# Patient Record
Sex: Female | Born: 1995 | Race: White | Hispanic: No | Marital: Single | Smoking: Never smoker
Health system: Southern US, Community
[De-identification: ages and names within clinical notes are randomized; demographics above are authoritative.]

## PROBLEM LIST (undated history)

## (undated) DIAGNOSIS — K3184 Gastroparesis: Secondary | ICD-10-CM

## (undated) DIAGNOSIS — F419 Anxiety disorder, unspecified: Secondary | ICD-10-CM

## (undated) HISTORY — PX: CHOLECYSTECTOMY: SHX55

---

## 2013-04-02 ENCOUNTER — Encounter (HOSPITAL_COMMUNITY): Payer: Self-pay | Admitting: Emergency Medicine

## 2013-04-02 ENCOUNTER — Emergency Department (HOSPITAL_COMMUNITY)
Admission: EM | Admit: 2013-04-02 | Discharge: 2013-04-02 | Disposition: A | Discharge status: Home / Self Care | Payer: BC Managed Care – PPO | Attending: Emergency Medicine | Admitting: Emergency Medicine

## 2013-04-02 DIAGNOSIS — R1032 Left lower quadrant pain: Secondary | ICD-10-CM | POA: Insufficient documentation

## 2013-04-02 DIAGNOSIS — K625 Hemorrhage of anus and rectum: Secondary | ICD-10-CM | POA: Insufficient documentation

## 2013-04-02 DIAGNOSIS — R1031 Right lower quadrant pain: Secondary | ICD-10-CM | POA: Insufficient documentation

## 2013-04-02 LAB — COMPREHENSIVE METABOLIC PANEL
ALT: 13 U/L (ref 0–35)
Albumin: 4.4 g/dL (ref 3.5–5.2)
Alkaline Phosphatase: 52 U/L (ref 39–117)
BUN: 12 mg/dL (ref 6–23)
Calcium: 9.6 mg/dL (ref 8.4–10.5)
Creatinine, Ser: 0.79 mg/dL (ref 0.50–1.10)
GFR calc non Af Amer: >90 mL/min (ref >90)
Glucose, Bld: 70 mg/dL (ref 70–99)
Potassium: 4.5 mEq/L (ref 3.5–5.1)
Sodium: 137 mEq/L (ref 135–145)
Total Bilirubin: 0.5 mg/dL (ref 0.3–1.2)
Total Protein: 7.6 g/dL (ref 6.0–8.3)

## 2013-04-02 LAB — CBC
Hemoglobin: 13.1 g/dL (ref 12.0–15.0)
MCH: 30.5 pg (ref 26.0–34.0)
MCHC: 33.2 g/dL (ref 30.0–36.0)
MCV: 91.8 fL (ref 78.0–100.0)
RBC: 4.29 MIL/uL (ref 3.87–5.11)
RDW: 13 % (ref 11.5–15.5)

## 2013-04-02 NOTE — ED Notes (Signed)
This RN present for rectal exam performed by Trixie Dredge PA

## 2013-04-02 NOTE — ED Notes (Signed)
Pt comfortable with d/c and f/u instructions. No prescriptions 

## 2013-04-02 NOTE — ED Notes (Signed)
Reports having a bowel movement today and having blood on toilet paper x 2. No acute distress noted at triage.

## 2013-04-02 NOTE — ED Provider Notes (Signed)
CSN: [id number]     Arrival date & time 04/02/13  1657 History   First MD Initiated Contact with Patient 04/02/13 2017     Chief Complaint  Patient presents with  . Rectal Bleeding   (Consider location/radiation/quality/duration/timing/severity/associated sxs/prior Treatment) HPI  Patient reports she had a bowel movement today and found a small amount of blood on the toilet paper.  States she checked again later and again had a small amount of blood on the toilet paper.  She did not look in the toilet to check for blood.  She did check to confirm is was not vaginal blood.  Notes a very mild discomfort in her lower abdomen, when asked how bad it is she states "not that bad."  Denies fevers, chills, N/V, urinary symptoms, abnormal vaginal bleeding or discharge.  Patient's mother has Crohn's disease. She has never had rectal bleeding previously.   History reviewed. No pertinent past medical history. History reviewed. No pertinent past surgical history. History reviewed. No pertinent family history. History  Substance Use Topics  . Smoking status: Never Smoker   . Smokeless tobacco: Not on file  . Alcohol Use: No   OB History   Grav Para Term Preterm Abortions TAB SAB Ect Mult Living                 Review of Systems  Constitutional: Negative for fever and chills.  Respiratory: Negative for shortness of breath.   Cardiovascular: Negative for chest pain.  Gastrointestinal: Positive for anal bleeding. Negative for nausea, vomiting and diarrhea.  Genitourinary: Negative for dysuria, urgency, frequency, vaginal bleeding and vaginal discharge.    Allergies  Review of patient's allergies indicates no known allergies.  Home Medications  No current outpatient prescriptions on file. BP 122/70  Pulse 60  Temp(Src) 98.8 F (37.1 C) (Oral)  Resp 16  SpO2 100%  LMP 03/12/2013 Physical Exam  Nursing note and vitals reviewed. Constitutional: She appears well-developed and  well-nourished. No distress.  HENT:  Head: Normocephalic and atraumatic.  Neck: Neck supple.  Cardiovascular: Normal rate and regular rhythm.   Pulmonary/Chest: Effort normal and breath sounds normal. No respiratory distress. She has no wheezes. She has no rales.  Abdominal: Soft. She exhibits no distension. There is tenderness. There is no rigidity, no rebound and no guarding.  Very mild pain across lower abdomen R>L  Genitourinary: Rectal exam shows no external hemorrhoid, no internal hemorrhoid, no fissure, no mass, no tenderness and anal tone normal.  Scant amount of yellow/brown stool with perhaps 1-2 small dots of red (1mm)  Neurological: She is alert.  Skin: She is not diaphoretic.    ED Course  Procedures (including critical care time) Labs Review Labs Reviewed  POCT PREGNANCY, URINE - Abnormal; Notable for the following:    Preg Test, Ur POSITIVE (*)    All other components within normal limits  OCCULT BLOOD, POC DEVICE - Abnormal; Notable for the following:    Fecal Occult Bld POSITIVE (*)    All other components within normal limits  CBC  COMPREHENSIVE METABOLIC PANEL   Imaging Review No results found.  EKG Interpretation   None      Pregnancy test noted to be positive - this is actually the hemoccult result.  Patient did not give a urine sample and does not believe she could be pregnant at this time.  We did not order or run a pregnancy test in the ED today.   MDM   1. Rectal bleeding  Pt with two episodes of small amount of blood on toilet paper today.  Pt has very mild lower abdominal discomfort.  Abdominal exam is nonsurgical.  Hgb is normal.  No gross blood on exam.  Hemoccult is positive.  No hemorrhoids noted on exam.  Patient has a family hx crohn's disease, d/c home with GI follow up.  Discussed result, findings, treatment, and follow up  with patient.  Pt given return precautions.  Pt verbalizes understanding and agrees with plan.        Trixie Dredge,  PA-C 04/02/13 2355

## 2013-04-02 NOTE — ED Notes (Signed)
Per [facility], Positive Pregnancy Test documented in error. Pregnancy test not performed on patient.

## 2013-04-02 NOTE — ED Notes (Signed)
Enter preg test in machine in error

## 2013-04-03 LAB — POCT PREGNANCY, URINE: Preg Test, Ur: POSITIVE — AB

## 2013-04-03 NOTE — ED Provider Notes (Signed)
Medical screening examination/treatment/procedure(s) were performed by non-physician practitioner and as supervising physician I was immediately available for consultation/collaboration.  Kasyn Rolph L Eames Dibiasio, MD 04/03/13 0004 

## 2013-04-07 ENCOUNTER — Other Ambulatory Visit: Payer: Self-pay | Admitting: Gastroenterology

## 2013-04-07 DIAGNOSIS — R109 Unspecified abdominal pain: Secondary | ICD-10-CM

## 2013-04-09 ENCOUNTER — Ambulatory Visit
Admission: RE | Admit: 2013-04-09 | Discharge: 2013-04-09 | Disposition: A | Discharge status: Home / Self Care | Payer: BC Managed Care – PPO | Source: Ambulatory Visit | Attending: Gastroenterology | Admitting: Gastroenterology

## 2013-04-09 DIAGNOSIS — R109 Unspecified abdominal pain: Secondary | ICD-10-CM

## 2013-04-09 MED ORDER — IOHEXOL 300 MG/ML  SOLN: 100.0000 mL | Freq: Once | INTRAMUSCULAR | Status: AC | PRN

## 2013-07-28 ENCOUNTER — Emergency Department (HOSPITAL_COMMUNITY): Payer: BC Managed Care – PPO

## 2013-07-28 ENCOUNTER — Emergency Department (HOSPITAL_COMMUNITY)
Admission: EM | Admit: 2013-07-28 | Discharge: 2013-07-28 | Disposition: A | Discharge status: Home / Self Care | Payer: BC Managed Care – PPO | Attending: Emergency Medicine | Admitting: Emergency Medicine

## 2013-07-28 ENCOUNTER — Encounter (HOSPITAL_COMMUNITY): Payer: Self-pay | Admitting: Emergency Medicine

## 2013-07-28 DIAGNOSIS — W219XXA Striking against or struck by unspecified sports equipment, initial encounter: Secondary | ICD-10-CM | POA: Insufficient documentation

## 2013-07-28 DIAGNOSIS — J069 Acute upper respiratory infection, unspecified: Secondary | ICD-10-CM | POA: Insufficient documentation

## 2013-07-28 DIAGNOSIS — S060X9A Concussion with loss of consciousness of unspecified duration, initial encounter: Secondary | ICD-10-CM

## 2013-07-28 DIAGNOSIS — Y9367 Activity, basketball: Secondary | ICD-10-CM | POA: Insufficient documentation

## 2013-07-28 DIAGNOSIS — Y9239 Other specified sports and athletic area as the place of occurrence of the external cause: Secondary | ICD-10-CM | POA: Insufficient documentation

## 2013-07-28 DIAGNOSIS — Y92838 Other recreation area as the place of occurrence of the external cause: Secondary | ICD-10-CM

## 2013-07-28 DIAGNOSIS — S060XAA Concussion with loss of consciousness status unknown, initial encounter: Secondary | ICD-10-CM | POA: Insufficient documentation
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
## 2013-07-28 NOTE — Discharge Instructions (Signed)
Concussion, Adult A concussion, or closed-head injury, is a brain injury caused by a direct blow to the head or by a quick and sudden movement (jolt) of the head or neck. Concussions are usually not life-threatening. Even so, the effects of a concussion can be serious. If you have had a concussion before, you are more likely to experience concussion-like symptoms after a direct blow to the head.  CAUSES   Direct blow to the head, such as from running into another player during a soccer game, being hit in a fight, or hitting your head on a hard surface.  A jolt of the head or neck that causes the brain to move back and forth inside the skull, such as in a car crash. SIGNS AND SYMPTOMS  The signs of a concussion can be hard to notice. Early on, they may be missed by you, family members, and health care providers. You may look fine but act or feel differently. Symptoms are usually temporary, but they may last for days, weeks, or even longer. Some symptoms may appear right away while others may not show up for hours or days. Every head injury is different. Symptoms include:   Mild to moderate headaches that will not go away.  A feeling of pressure inside your head.  Having more trouble than usual:   Learning or remembering things you have heard.  Answering questions.  Paying attention or concentrating.   Organizing daily tasks.   Making decisions and solving problems.   Slowness in thinking, acting or reacting, speaking, or reading.   Getting lost or being easily confused.   Feeling tired all the time or lacking energy (fatigued).   Feeling drowsy.   Sleep disturbances.   Sleeping more than usual.   Sleeping less than usual.   Trouble falling asleep.   Trouble sleeping (insomnia).   Loss of balance or feeling lightheaded or dizzy.   Nausea or vomiting.   Numbness or tingling.   Increased sensitivity to:   Sounds.   Lights.   Distractions.    Vision problems or eyes that tire easily.   Diminished sense of taste or smell.   Ringing in the ears.   Mood changes such as feeling sad or anxious.   Becoming easily irritated or angry for little or no reason.   Lack of motivation.  Seeing or hearing things other people do not see or hear (hallucinations). DIAGNOSIS  Your health care provider can usually diagnose a concussion based on a description of your injury and symptoms. He or she will ask whether you passed out (lost consciousness) and whether you are having trouble remembering events that happened right before and during your injury.  Your evaluation might include:   A brain scan to look for signs of injury to the brain. Even if the test shows no injury, you may still have a concussion.   Blood tests to be sure other problems are not present. TREATMENT   Concussions are usually treated in an emergency department, in urgent care, or at a clinic. You may need to stay in the hospital overnight for further treatment.   Tell your health care provider if you are taking any medicines, including prescription medicines, over-the-counter medicines, and natural remedies. Some medicines, such as blood thinners (anticoagulants) and aspirin, may increase the chance of complications. Also tell your health care provider whether you have had alcohol or are taking illegal drugs. This information may affect treatment.  Your health care provider will send you  home with important instructions to follow. °· How fast you will recover from a concussion depends on many factors. These factors include how severe your concussion is, what part of your brain was injured, your age, and how healthy you were before the concussion. °· Most people with mild injuries recover fully. Recovery can take time. In general, recovery is slower in older persons. Also, persons who have had a concussion in the past or have other medical problems may find that it  takes longer to recover from their current injury. °HOME CARE INSTRUCTIONS  °General Instructions °· Carefully follow the directions your health care provider gave you. °· Only take over-the-counter or prescription medicines for pain, discomfort, or fever as directed by your health care provider. °· Take only those medicines that your health care provider has approved. °· Do not drink alcohol until your health care provider says you are well enough to do so. Alcohol and certain other drugs may slow your recovery and can put you at risk of further injury. °· If it is harder than usual to remember things, write them down. °· If you are easily distracted, try to do one thing at a time. For example, do not try to watch TV while fixing dinner. °· Talk with family members or close friends when making important decisions. °· Keep all follow-up appointments. Repeated evaluation of your symptoms is recommended for your recovery. °· Watch your symptoms and tell others to do the same. Complications sometimes occur after a concussion. Older adults with a brain injury may have a higher risk of serious complications such as of a blood clot on the brain. °· Tell your teachers, school nurse, school counselor, coach, athletic trainer, or work manager about your injury, symptoms, and restrictions. Tell them about what you can or cannot do. They should watch for:   °· Increased problems with attention or concentration.   °· Increased difficulty remembering or learning new information.   °· Increased time needed to complete tasks or assignments.   °· Increased irritability or decreased ability to cope with stress.   °· Increased symptoms.   °· Rest. Rest helps the brain to heal. Make sure you: °· Get plenty of sleep at night. Avoid staying up late at night. °· Keep the same bedtime hours on weekends and weekdays. °· Rest during the day. Take daytime naps or rest breaks when you feel tired. °· Limit activities that require a lot of  thought or concentration. These includes   °· Doing homework or job-related work.   °· Watching TV.   °· Working on the computer. °· Avoid any situation where there is potential for another head injury (football, hockey, soccer, basketball, martial arts, downhill snow sports and horseback riding). Your condition will get worse every time you experience a concussion. You should avoid these activities until you are evaluated by the appropriate follow-up caregivers. °Returning To Your Regular Activities °You will need to return to your normal activities slowly, not all at once. You must give your body and brain enough time for recovery. °· Do not return to sports or other athletic activities until your health care provider tells you it is safe to do so. °· Ask your health care provider when you can drive, ride a bicycle, or operate heavy machinery. Your ability to react may be slower after a brain injury. Never do these activities if you are dizzy. °· Ask your health care provider about when you can return to work or school. °Preventing Another Concussion °It is very important to avoid another   brain injury, especially before you have recovered. In rare cases, another injury can lead to permanent brain damage, brain swelling, or death. The risk of this is greatest during the first 7 10 days after a head injury. Avoid injuries by:   Wearing a seat belt when riding in a car.   Drinking alcohol only in moderation.   Wearing a helmet when biking, skiing, skateboarding, skating, or doing similar activities.  Avoiding activities that could lead to a second concussion, such as contact or recreational sports, until your health care provider says it is OK.  Taking safety measures in your home.   Remove clutter and tripping hazards from floors and stairways.   Use grab bars in bathrooms and handrails by stairs.   Place non-slip mats on floors and in bathtubs.   Improve lighting in dim areas. SEEK MEDICAL  CARE IF:   You have increased problems paying attention or concentrating.   You have increased difficulty remembering or learning new information.   You need more time to complete tasks or assignments than before.   You have increased irritability or decreased ability to cope with stress.  You have more symptoms than before. Seek medical care if you have any of the following symptoms for more than 2 weeks after your injury:   Lasting (chronic) headaches.   Dizziness or balance problems.   Nausea.  Vision problems.   Increased sensitivity to noise or light.   Depression or mood swings.   Anxiety or irritability.   Memory problems.   Difficulty concentrating or paying attention.   Sleep problems.   Feeling tired all the time. SEEK IMMEDIATE MEDICAL CARE IF:   You have severe or worsening headaches. These may be a sign of a blood clot in the brain.  You have weakness (even if only in one hand, leg, or part of the face).  You have numbness.  You have decreased coordination.   You vomit repeatedly.  You have increased sleepiness.  One pupil is larger than the other.   You have convulsions.   You have slurred speech.   You have increased confusion. This may be a sign of a blood clot in the brain.  You have increased restlessness, agitation, or irritability.   You are unable to recognize people or places.   You have neck pain.   It is difficult to wake you up.   You have unusual behavior changes.   You lose consciousness. MAKE SURE YOU:   Understand these instructions.  Will watch your condition.  Will get help right away if you are not doing well or get worse. Document Released: 08/11/2003 Document Revised: 01/21/2013 Document Reviewed: 12/11/2012 [facility] Patient Information 2014 [place], [place].  Upper Respiratory Infection, Adult An upper respiratory infection (URI) is also sometimes known as the common cold. The upper  respiratory tract includes the nose, sinuses, throat, trachea, and bronchi. Bronchi are the airways leading to the lungs. Most people improve within 1 week, but symptoms can last up to 2 weeks. A residual cough may last even longer.  CAUSES Many different viruses can infect the tissues lining the upper respiratory tract. The tissues become irritated and inflamed and often become very moist. Mucus production is also common. A cold is contagious. You can easily spread the virus to others by oral contact. This includes kissing, sharing a glass, coughing, or sneezing. Touching your mouth or nose and then touching a surface, which is then touched by another person, can also spread the  virus. SYMPTOMS  Symptoms typically develop 1 to 3 days after you come in contact with a cold virus. Symptoms vary from person to person. They may include:  Runny nose.  Sneezing.  Nasal congestion.  Sinus irritation.  Sore throat.  Loss of voice (laryngitis).  Cough.  Fatigue.  Muscle aches.  Loss of appetite.  Headache.  Low-grade fever. DIAGNOSIS  You might diagnose your own cold based on familiar symptoms, since most people get a cold 2 to 3 times a year. Your caregiver can confirm this based on your exam. Most importantly, your caregiver can check that your symptoms are not due to another disease such as strep throat, sinusitis, pneumonia, asthma, or epiglottitis. Blood tests, throat tests, and X-rays are not necessary to diagnose a common cold, but they may sometimes be helpful in excluding other more serious diseases. Your caregiver will decide if any further tests are required. RISKS AND COMPLICATIONS  You may be at risk for a more severe case of the common cold if you smoke cigarettes, have chronic heart disease (such as heart failure) or lung disease (such as asthma), or if you have a weakened immune system. The very young and very old are also at risk for more serious infections. Bacterial  sinusitis, middle ear infections, and bacterial pneumonia can complicate the common cold. The common cold can worsen asthma and chronic obstructive pulmonary disease (COPD). Sometimes, these complications can require emergency medical care and may be life-threatening. PREVENTION  The best way to protect against getting a cold is to practice good hygiene. Avoid oral or hand contact with people with cold symptoms. Wash your hands often if contact occurs. There is no clear evidence that vitamin C, vitamin E, echinacea, or exercise reduces the chance of developing a cold. However, it is always recommended to get plenty of rest and practice good nutrition. TREATMENT  Treatment is directed at relieving symptoms. There is no cure. Antibiotics are not effective, because the infection is caused by a virus, not by bacteria. Treatment may include:  Increased fluid intake. Sports drinks offer valuable electrolytes, sugars, and fluids.  Breathing heated mist or steam (vaporizer or shower).  Eating chicken soup or other clear broths, and maintaining good nutrition.  Getting plenty of rest.  Using gargles or lozenges for comfort.  Controlling fevers with ibuprofen or acetaminophen as directed by your caregiver.  Increasing usage of your inhaler if you have asthma. Zinc gel and zinc lozenges, taken in the first 24 hours of the common cold, can shorten the duration and lessen the severity of symptoms. Pain medicines may help with fever, muscle aches, and throat pain. A variety of non-prescription medicines are available to treat congestion and runny nose. Your caregiver can make recommendations and may suggest nasal or lung inhalers for other symptoms.  HOME CARE INSTRUCTIONS   Only take over-the-counter or prescription medicines for pain, discomfort, or fever as directed by your caregiver.  Use a warm mist humidifier or inhale steam from a shower to increase air moisture. This may keep secretions moist and  make it easier to breathe.  Drink enough water and fluids to keep your urine clear or pale yellow.  Rest as needed.  Return to work when your temperature has returned to normal or as your caregiver advises. You may need to stay home longer to avoid infecting others. You can also use a face mask and careful hand washing to prevent spread of the virus. SEEK MEDICAL CARE IF:   After the  first few days, you feel you are getting worse rather than better.  You need your caregiver's advice about medicines to control symptoms.  You develop chills, worsening shortness of breath, or brown or red sputum. These may be signs of pneumonia.  You develop yellow or brown nasal discharge or pain in the face, especially when you bend forward. These may be signs of sinusitis.  You develop a fever, swollen neck glands, pain with swallowing, or white areas in the back of your throat. These may be signs of strep throat. SEEK IMMEDIATE MEDICAL CARE IF:   You have a fever.  You develop severe or persistent headache, ear pain, sinus pain, or chest pain.  You develop wheezing, a prolonged cough, cough up blood, or have a change in your usual mucus (if you have chronic lung disease).  You develop sore muscles or a stiff neck. Document Released: 11/14/2000 Document Revised: 08/13/2011 Document Reviewed: 09/22/2010 [facility] Patient Information 2014 [place].

## 2013-09-03 ENCOUNTER — Emergency Department (HOSPITAL_COMMUNITY)
Admission: EM | Admit: 2013-09-03 | Discharge: 2013-09-03 | Disposition: A | Discharge status: Home / Self Care | Payer: BC Managed Care – PPO | Attending: Emergency Medicine | Admitting: Emergency Medicine

## 2013-09-03 ENCOUNTER — Encounter (HOSPITAL_COMMUNITY): Payer: Self-pay | Admitting: Emergency Medicine

## 2013-09-03 DIAGNOSIS — Z3202 Encounter for pregnancy test, result negative: Secondary | ICD-10-CM | POA: Insufficient documentation

## 2013-09-03 DIAGNOSIS — R630 Anorexia: Secondary | ICD-10-CM | POA: Insufficient documentation

## 2013-09-03 DIAGNOSIS — R110 Nausea: Secondary | ICD-10-CM | POA: Insufficient documentation

## 2013-09-03 LAB — POC URINE PREG, ED: PREG TEST UR: NEGATIVE

## 2013-09-03 LAB — COMPREHENSIVE METABOLIC PANEL
ALBUMIN: 4.7 g/dL (ref 3.5–5.2)
ALT: 12 U/L (ref 0–35)
AST: 14 U/L (ref 0–37)
Alkaline Phosphatase: 43 U/L (ref 39–117)
BUN: 13 mg/dL (ref 6–23)
CALCIUM: 10.5 mg/dL (ref 8.4–10.5)
CO2: 27 mEq/L (ref 19–32)
CREATININE: 0.71 mg/dL (ref 0.50–1.10)
Chloride: 99 mEq/L (ref 96–112)
GFR calc Af Amer: >90 mL/min (ref >90)
GFR calc non Af Amer: >90 mL/min (ref >90)
Glucose, Bld: 88 mg/dL (ref 70–99)
Potassium: 4.6 mEq/L (ref 3.7–5.3)
Sodium: 139 mEq/L (ref 137–147)
Total Bilirubin: 0.7 mg/dL (ref 0.3–1.2)
Total Protein: 8 g/dL (ref 6.0–8.3)

## 2013-09-03 LAB — CBC WITH DIFFERENTIAL/PLATELET
BASOS ABS: 0 10*3/uL (ref 0.0–0.1)
BASOS PCT: 0 % (ref 0–1)
EOS PCT: 2 % (ref 0–5)
Eosinophils Absolute: 0.1 10*3/uL (ref 0.0–0.7)
HEMATOCRIT: 39.7 % (ref 36.0–46.0)
Hemoglobin: 13.1 g/dL (ref 12.0–15.0)
Lymphocytes Relative: 32 % (ref 12–46)
Lymphs Abs: 2 10*3/uL (ref 0.7–4.0)
MCH: 29.9 pg (ref 26.0–34.0)
MCHC: 33 g/dL (ref 30.0–36.0)
MCV: 90.6 fL (ref 78.0–100.0)
Monocytes Absolute: 0.4 10*3/uL (ref 0.1–1.0)
Monocytes Relative: 6 % (ref 3–12)
NEUTROS ABS: 3.9 10*3/uL (ref 1.7–7.7)
Neutrophils Relative %: 61 % (ref 43–77)
Platelets: 246 10*3/uL (ref 150–400)
RBC: 4.38 MIL/uL (ref 3.87–5.11)
RDW: 12.7 % (ref 11.5–15.5)
WBC: 6.3 10*3/uL (ref 4.0–10.5)

## 2013-09-03 LAB — URINALYSIS, ROUTINE W REFLEX MICROSCOPIC
Bilirubin Urine: NEGATIVE
Glucose, UA: NEGATIVE mg/dL
Hgb urine dipstick: NEGATIVE
KETONES UR: 40 mg/dL — AB
LEUKOCYTES UA: NEGATIVE
NITRITE: NEGATIVE
Protein, ur: NEGATIVE mg/dL
SPECIFIC GRAVITY, URINE: 1.027 (ref 1.005–1.030)
Urobilinogen, UA: 0.2 mg/dL (ref 0.0–1.0)
pH: 6 (ref 5.0–8.0)

## 2013-09-03 LAB — LIPASE, BLOOD: Lipase: 25 U/L (ref 11–59)

## 2013-09-03 MED ORDER — ONDANSETRON HCL 8 MG PO TABS: 8.0000 mg | ORAL_TABLET | Freq: Three times a day (TID) | ORAL | Status: DC | PRN

## 2013-09-03 MED ORDER — ONDANSETRON 8 MG PO TBDP
8.0000 mg | ORAL_TABLET | Freq: Once | ORAL | Status: AC
  Administered 2013-09-03: 8 mg via ORAL
  Filled 2013-09-03: qty 1

## 2013-09-03 NOTE — ED Notes (Signed)
Complaints of dizziness upon standing. States that due to her nausea she has not attempted to eat or drink adequately. Denies dysuria or constipation at present.

## 2013-09-03 NOTE — ED Provider Notes (Signed)
CSN: [id number]     Arrival date & time 09/03/13  1135 History   First MD Initiated Contact with Patient 09/03/13 1251     Chief Complaint  Patient presents with  . Nausea     (Consider location/radiation/quality/duration/timing/severity/associated sxs/prior Treatment) The history is provided by the patient.   Chelsea Frazier is a 18 y.o. female complains of persistent, nausea and anorexia, that started during her recent., 5 days ago. No vomiting, fever, chills, cough, shortness of breath, chest pain, weakness, or dizziness. She's had this problem recurrently for one year. She has had to be evaluated in the emergency department. She does not think that she is pregnant. There been no changes in bowel or urinary habits. She does not have dysuria or urinary frequency. There are no other known modifying factors.   History reviewed. No pertinent past medical history. History reviewed. No pertinent past surgical history. History reviewed. No pertinent family history. History  Substance Use Topics  . Smoking status: Never Smoker   . Smokeless tobacco: Not on file  . Alcohol Use: No   OB History   Grav Para Term Preterm Abortions TAB SAB Ect Mult Living                 Review of Systems  All other systems reviewed and are negative.      Allergies  Review of patient's allergies indicates no known allergies.  Home Medications   Current Outpatient Rx  Name  Route  Sig  Dispense  Refill  . ondansetron (ZOFRAN) 8 MG tablet   Oral   Take 1 tablet (8 mg total) by mouth every 8 (eight) hours as needed for nausea or vomiting.   20 tablet   0    BP 114/69  Pulse 73  Temp(Src) 97.8 F (36.6 C) (Oral)  Resp 17  SpO2 97%  LMP 08/23/2013 Physical Exam  Nursing note and vitals reviewed. Constitutional: She is oriented to person, place, and time. She appears well-developed and well-nourished.  HENT:  Head: Normocephalic and atraumatic.  Eyes: Conjunctivae and EOM are  normal. Pupils are equal, round, and reactive to light.  Neck: Normal range of motion and phonation normal. Neck supple.  Cardiovascular: Normal rate, regular rhythm and intact distal pulses.   Pulmonary/Chest: Effort normal and breath sounds normal. She exhibits no tenderness.  Abdominal: Soft. She exhibits no distension. There is no tenderness. There is no guarding.  Musculoskeletal: Normal range of motion.  Neurological: She is alert and oriented to person, place, and time. She exhibits normal muscle tone.  Skin: Skin is warm and dry.  Psychiatric: She has a normal mood and affect. Her behavior is normal. Judgment and thought content normal.    ED Course  Procedures (including critical care time) Medications  ondansetron (ZOFRAN-ODT) disintegrating tablet 8 mg (8 mg Oral Given 09/03/13 1235)    Patient Vitals for the past 24 hrs:  BP Temp Temp src Pulse Resp SpO2  09/03/13 1206 114/69 mmHg 97.8 F (36.6 C) Oral 73 17 97 %  09/03/13 1157 118/74 mmHg 98.2 F (36.8 C) Oral 63 16 100 %   13:50- findings were discussed with the patient, and her mother, by phone; all questions answered.  Labs Review Labs Reviewed  URINALYSIS, ROUTINE W REFLEX MICROSCOPIC - Abnormal; Notable for the following:    Color, Urine AMBER (*)    APPearance HAZY (*)    Ketones, ur 40 (*)    All other components within normal limits  CBC  WITH DIFFERENTIAL  COMPREHENSIVE METABOLIC PANEL  LIPASE, BLOOD  POC URINE PREG, ED    MDM   Final diagnoses:  Nausea    Nonspecific nausea, with negative ED evaluation. Doubt serious bacterial infection, metabolic instability, impending vascular collapse. She's not pregnant and there is no apparent UTI.  Nursing Notes Reviewed/ Care Coordinated Applicable Imaging Reviewed Interpretation of Laboratory Data incorporated into ED treatment  The patient appears reasonably screened and/or stabilized for discharge and I doubt any other medical condition or other [facility]  requiring further screening, evaluation, or treatment in the ED at this time prior to discharge.  Plan: Home Medications- Zofran; Home Treatments- rest, push PO fluids; return here if the recommended treatment, does not improve the symptoms; Recommended follow up- PCP prn    Flint MelterElliott L Nikira Kushnir, MD 09/03/13 1357

## 2013-09-03 NOTE — Discharge Instructions (Signed)
Use the nausea medicine, before trying to eat. Try to drink both water and Gatorade, frequently. See the gynecologist as scheduled, to be evaluated for a menstrual abnormality. Return here, as needed, for problems.  Nausea and Vomiting Nausea is a sick feeling that often comes before throwing up (vomiting). Vomiting is a reflex where stomach contents come out of your mouth. Vomiting can cause severe loss of body fluids (dehydration). Children and elderly adults can become dehydrated quickly, especially if they also have diarrhea. Nausea and vomiting are symptoms of a condition or disease. It is important to find the cause of your symptoms. CAUSES   Direct irritation of the stomach lining. This irritation can result from increased acid production (gastroesophageal reflux disease), infection, food poisoning, taking certain medicines (such as nonsteroidal anti-inflammatory drugs), alcohol use, or tobacco use.  Signals from the brain.These signals could be caused by a headache, heat exposure, an inner ear disturbance, increased pressure in the brain from injury, infection, a tumor, or a concussion, pain, emotional stimulus, or metabolic problems.  An obstruction in the gastrointestinal tract (bowel obstruction).  Illnesses such as diabetes, hepatitis, gallbladder problems, appendicitis, kidney problems, cancer, sepsis, atypical symptoms of a heart attack, or eating disorders.  Medical treatments such as chemotherapy and radiation.  Receiving medicine that makes you sleep (general anesthetic) during surgery. DIAGNOSIS Your caregiver may ask for tests to be done if the problems do not improve after a few days. Tests may also be done if symptoms are severe or if the reason for the nausea and vomiting is not clear. Tests may include:  Urine tests.  Blood tests.  Stool tests.  Cultures (to look for evidence of infection).  X-rays or other imaging studies. Test results can help your caregiver  make decisions about treatment or the need for additional tests. TREATMENT You need to stay well hydrated. Drink frequently but in small amounts.You may wish to drink water, sports drinks, clear broth, or eat frozen ice pops or gelatin dessert to help stay hydrated.When you eat, eating slowly may help prevent nausea.There are also some antinausea medicines that may help prevent nausea. HOME CARE INSTRUCTIONS   Take all medicine as directed by your caregiver.  If you do not have an appetite, do not force yourself to eat. However, you must continue to drink fluids.  If you have an appetite, eat a normal diet unless your caregiver tells you differently.  Eat a variety of complex carbohydrates (rice, wheat, potatoes, bread), lean meats, yogurt, fruits, and vegetables.  Avoid high-fat foods because they are more difficult to digest.  Drink enough water and fluids to keep your urine clear or pale yellow.  If you are dehydrated, ask your caregiver for specific rehydration instructions. Signs of dehydration may include:  Severe thirst.  Dry lips and mouth.  Dizziness.  Dark urine.  Decreasing urine frequency and amount.  Confusion.  Rapid breathing or pulse. SEEK IMMEDIATE MEDICAL CARE IF:   You have blood or brown flecks (like coffee grounds) in your vomit.  You have black or bloody stools.  You have a severe headache or stiff neck.  You are confused.  You have severe abdominal pain.  You have chest pain or trouble breathing.  You do not urinate at least once every 8 hours.  You develop cold or clammy skin.  You continue to vomit for longer than 24 to 48 hours.  You have a fever. MAKE SURE YOU:   Understand these instructions.  Will watch your condition.  Will get help right away if you are not doing well or get worse. Document Released: 05/21/2005 Document Revised: 08/13/2011 Document Reviewed: 10/18/2010 [facility] Patient Information 2014 [place], [place].

## 2013-09-03 NOTE — ED Notes (Signed)
Voiced understanding of instructions given 

## 2013-09-03 NOTE — ED Notes (Signed)
Pt states she is having nausea with decreased appetite.  Slight abdominal pain.  Pt states she normally does this with each menstrual period but it normally goes away.  Symptoms not getting better this month.

## 2013-09-08 ENCOUNTER — Encounter: Payer: Self-pay | Admitting: Obstetrics

## 2013-09-08 ENCOUNTER — Encounter (INDEPENDENT_AMBULATORY_CARE_PROVIDER_SITE_OTHER): Payer: Self-pay

## 2013-09-08 ENCOUNTER — Ambulatory Visit (INDEPENDENT_AMBULATORY_CARE_PROVIDER_SITE_OTHER): Payer: BC Managed Care – PPO | Admitting: Obstetrics

## 2013-09-08 VITALS — BP 110/69 | HR 65 | Temp 97.1°F | Ht 67.0 in | Wt 120.0 lb

## 2013-09-08 DIAGNOSIS — Z3009 Encounter for other general counseling and advice on contraception: Secondary | ICD-10-CM

## 2013-09-08 DIAGNOSIS — N946 Dysmenorrhea, unspecified: Secondary | ICD-10-CM

## 2013-09-08 MED ORDER — NORETHIN-ETH ESTRAD-FE BIPHAS 1 MG-10 MCG / 10 MCG PO TABS: 1.0000 | ORAL_TABLET | Freq: Every day | ORAL | Status: DC

## 2013-09-08 MED ORDER — IBUPROFEN 800 MG PO TABS: 800.0000 mg | ORAL_TABLET | Freq: Three times a day (TID) | ORAL | Status: DC

## 2013-09-08 NOTE — Progress Notes (Addendum)
Subjective:     Chelsea Frazier is a 18 y.o. female here for a routine exam.  Current complaints: Patient in office today for a consult due to her period. Patient states she is having painful menstrual cycles. Patient states the pain is a sharp shooting pain that comes and goes. Patient states she is also having nausea and headaches to accompany the pain. Patient states she has not tried any comfort measures to help with the pain.  Patient states in November she had a CT Scan that showed a cyst on her left ovary.     The HPI was reviewed and explored in further detail by the provider. Gynecologic History Patient's last menstrual period was 08/23/2013. Contraception: abstinence Last Pap: Never had a Pap  Obstetric History OB History  Gravida Para Term Preterm AB SAB TAB Ectopic Multiple Living  0 0 0 0 0 0 0 0 0 0          The following portions of the patient's history were reviewed and updated as appropriate: allergies, current medications, past family history, past medical history, past social history, past surgical history and problem list.  Review of Systems Genitourinary:positive for abnormal menstrual periods    Objective:    General appearance: alert and no distress Lungs: clear to auscultation bilaterally Heart: regular rate and rhythm, S1, S2 normal, no murmur, click, rub or gallop Abdomen: normal findings: soft, non-tender Pelvic: cervix normal in appearance, external genitalia normal, no adnexal masses or tenderness, no cervical motion tenderness, rectovaginal septum normal, uterus normal size, shape, and consistency and vagina normal without discharge      Assessment:    Dysmenorrhea  Ovarian cyst.  Normal physiologic cyst on CT scan.  Will follow clinically.   Plan:    Education reviewed: safe sex/STD prevention and management of dysmenorhea.. Contraception: OCP (estrogen/progesterone). Follow up in: 3 months. Ibuprofen Rx for dysmenorrhea.

## 2013-09-10 ENCOUNTER — Encounter: Payer: Self-pay | Admitting: Obstetrics

## 2013-09-21 ENCOUNTER — Encounter: Payer: Self-pay | Admitting: Obstetrics

## 2014-01-26 ENCOUNTER — Emergency Department (HOSPITAL_COMMUNITY)
Admission: EM | Admit: 2014-01-26 | Discharge: 2014-01-26 | Disposition: A | Discharge status: Home / Self Care | Payer: BC Managed Care – PPO | Attending: Emergency Medicine | Admitting: Emergency Medicine

## 2014-01-26 ENCOUNTER — Encounter (HOSPITAL_COMMUNITY): Payer: Self-pay | Admitting: Emergency Medicine

## 2014-01-26 DIAGNOSIS — F411 Generalized anxiety disorder: Secondary | ICD-10-CM | POA: Diagnosis not present

## 2014-01-26 DIAGNOSIS — J029 Acute pharyngitis, unspecified: Secondary | ICD-10-CM | POA: Diagnosis present

## 2014-01-26 LAB — RAPID STREP SCREEN (MED CTR MEBANE ONLY): Streptococcus, Group A Screen (Direct): NEGATIVE

## 2014-01-26 MED ORDER — LORAZEPAM 1 MG PO TABS
1.0000 mg | ORAL_TABLET | Freq: Once | ORAL | Status: AC
  Administered 2014-01-26: 1 mg via ORAL
  Filled 2014-01-26: qty 1

## 2014-01-26 MED ORDER — PREDNISONE 50 MG PO TABS: 50.0000 mg | ORAL_TABLET | Freq: Every day | ORAL | Status: DC

## 2014-01-26 MED ORDER — ACETAMINOPHEN-CODEINE 120-12 MG/5ML PO SOLN: 10.0000 mL | ORAL | Status: DC | PRN

## 2014-01-26 MED ORDER — AMOXICILLIN 875 MG PO TABS: 875.0000 mg | ORAL_TABLET | Freq: Two times a day (BID) | ORAL | Status: DC

## 2014-01-26 NOTE — Discharge Instructions (Signed)
Return here as needed.  Increase your fluid intake, rest as much possible. °

## 2014-01-26 NOTE — ED Provider Notes (Signed)
CSN: [id number]     Arrival date & time 01/26/14  0848 History   First MD Initiated Contact with Patient 01/26/14 1005     Chief Complaint  Patient presents with  . Sore Throat     (Consider location/radiation/quality/duration/timing/severity/associated sxs/prior Treatment) HPI Patient presents to the emergency department with sore throat, and mild cough, over the last few days.  The patient, states, that she feels, like her throat is swelling.  The patient, states, that she has had tingling in both of her hands as well.  Patient denies earache, chest pain, shortness of breath, vomiting, fever, abdominal pain, or syncope.  Patient, states, that she did not take any medications prior to arrival other than over-the-counter cough suppressants.  Patient, states nothing seems to make her condition, better or worse History reviewed. No pertinent past medical history. History reviewed. No pertinent past surgical history. No family history on file. History  Substance Use Topics  . Smoking status: Never Smoker   . Smokeless tobacco: Not on file  . Alcohol Use: No   OB History   Grav Para Term Preterm Abortions TAB SAB Ect Mult Living       Review of Systems   All other systems negative except as documented in the HPI. All pertinent positives and negatives as reviewed in the HPI. Allergies  Review of patient's allergies indicates no known allergies.  Home Medications   Prior to Admission medications   Medication Sig Start Date End Date Taking? Authorizing Provider  Chlorphen-Pseudoephed-APAP (FLU/COLD MEDICINE PO) Take 2 tablets by mouth once.   Yes Historical Provider, MD  ondansetron (ZOFRAN) 4 MG tablet Take 4 mg by mouth every 8 (eight) hours as needed for nausea or vomiting.   Yes Historical Provider, MD   BP 110/77  Pulse 74  Temp(Src) 98 F (36.7 C) (Oral)  Resp 18  SpO2 100%  LMP 01/21/2014 Physical Exam  Nursing note and vitals  reviewed. Constitutional: She is oriented to person, place, and time. She appears well-developed and well-nourished. No distress.  Patient appears very anxious and is crying on my examination  HENT:  Head: Normocephalic and atraumatic.  Right Ear: Tympanic membrane normal.  Left Ear: Tympanic membrane normal.  Mouth/Throat: Uvula is midline and mucous membranes are normal. No trismus in the jaw. No uvula swelling. Posterior oropharyngeal erythema present. No oropharyngeal exudate, posterior oropharyngeal edema or tonsillar abscesses.  Eyes: Pupils are equal, round, and reactive to light.  Neck: Normal range of motion. Neck supple.  Cardiovascular: Normal rate, regular rhythm and normal heart sounds.  Exam reveals no gallop and no friction rub.   No murmur heard. Pulmonary/Chest: Effort normal and breath sounds normal. No respiratory distress.  Neurological: She is alert and oriented to person, place, and time.  Skin: Skin is warm and dry. No rash noted. No erythema.  Psychiatric: Her mood appears anxious.    ED Course  Procedures (including critical care time) Labs Review Labs Reviewed  RAPID STREP SCREEN  CULTURE, GROUP A STREP   Patient be treated for pharyngitis, and advised to return here as needed.  Told to increase her fluid intake, and rest as much possible.  Patient was given Ativan here in the emergency department due to her anxiety.    Carlyle Dolly, PA-C 01/26/14 1125

## 2014-01-28 LAB — CULTURE, GROUP A STREP

## 2014-01-29 NOTE — ED Provider Notes (Signed)
Medical screening examination/treatment/procedure(s) were performed by non-physician practitioner and as supervising physician I was immediately available for consultation/collaboration.   Thomasenia Dowse T Aeden Matranga, MD 01/29/14 0744 

## 2014-06-17 ENCOUNTER — Encounter: Payer: Self-pay | Admitting: Sports Medicine

## 2014-06-17 ENCOUNTER — Ambulatory Visit (INDEPENDENT_AMBULATORY_CARE_PROVIDER_SITE_OTHER): Admitting: Sports Medicine

## 2014-06-17 VITALS — BP 102/67 | Temp 97.6°F | Ht 67.5 in | Wt 115.0 lb

## 2014-06-17 DIAGNOSIS — J069 Acute upper respiratory infection, unspecified: Secondary | ICD-10-CM | POA: Insufficient documentation

## 2014-06-17 DIAGNOSIS — B9789 Other viral agents as the cause of diseases classified elsewhere: Principal | ICD-10-CM

## 2014-06-17 MED ORDER — ALBUTEROL SULFATE HFA 108 (90 BASE) MCG/ACT IN AERS: 2.0000 | INHALATION_SPRAY | RESPIRATORY_TRACT | Status: DC | PRN

## 2014-06-17 MED ORDER — AZITHROMYCIN 500 MG PO TABS: 500.0000 mg | ORAL_TABLET | Freq: Every day | ORAL | Status: DC

## 2014-06-17 NOTE — Progress Notes (Signed)
  Chelsea Frazier - 19 y.o. female MRN [id number]  Date of birth: 01-14-1996  SUBJECTIVE:  Including CC & ROS.  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 no history of tobacco use, distant history of exercise inducted asthma but does not use inhaler regularly, no history of COPD. Denies Nausea, vomiting, diarrhea.  Appetite normal, and Drinking fluids Relieving factors: no improve with OTC tylenol cold/flu Symptoms not improving but no worse  HISTORY: Past Medical, Surgical, Social, and Family History Reviewed & Updated per EMR. Pertinent Historical Findings include: No asthma, nonsmoker Sick contact on basketbal team   ROS:  Constitutional:  No fever, chills, or fatigue.  Respiratory:  No shortness of breath, cough, or wheezing Cardiovascular:  No palpitations, chest pain or syncope Gastrointestinal:  No nausea, no abdominal pain Review of systems otherwise negative except for what is stated in HPI  PHYSICAL EXAM:  VS: BP:102/67 mmHg  HR: bpm  TEMP:97.6 F (36.4 C)(Oral)  RESP:   HT:5' 7.5" (171.5 cm)   WT:115 lb (52.164 kg)  BMI:17.8 PHYSICAL EXAM: General:  Alert and oriented, No acute distress.   HENT:  Normocephalic, Oral mucosa is moist.  Eyes are equal and reactive to light, normal conjunctivae, normal hearing, mucous membrane is moist, no erythema, no exudate.  Bilateral ears have fluid with bulging but no erythema, TMs are intact.  Both nasal passages are inflamed, erythematous, with clear drainage and inflamed turbinate.  Sinus passages are tender to palpation.  Submandibular glands are fluctuant mobile and soft.  Respiratory:  Lungs are clear to  auscultation, Respirations are non-labored, Symmetrical chest wall expansion.   Cardiovascular:  Normal rate, Regular rhythm, No murmur, Good pulses equal in all extremities, No edema.   Gastrointestinal:  Soft, Non-tender, Non-distended, Normal bowel sounds, No organomegaly.   Integumentary:  Warm, Dry, No rash.   Neurologic:  Alert, Oriented, No focal defects Psychiatric:  Cooperative, Appropriate mood & affect.    ASSESSMENT & PLAN:  URI Plan: I suspect the patient is suffering from a viral upper respiratory infection based on the short duration of the symptoms, non-productive cough, nasal congestion, clear pharynx, afebrile presentation with subjective fever, normal PO intake, and no significant signs of bacterial infection seen on physical exam. Recommendations for symptomatic relief with over-the-counter agents was recommended.  Patient was provided with a handout to guide them in choosing the appropriate over-the-counter agents for each of their symptoms. Patient was educated that they will benefit from symptomatic control with OTC decongestants, Tylenol or Motrin for fevers, saline nasal sprays, Plenty of fluids and rest, return if no better in 5-7 days, sooner if worse.  Predated prescription of azithromycin 500 mg daily for 5 days was sent to her pharmacy to start on Sunday if symptoms are worsening or persisting Also provided patient with albuterol inhaler to help with breathing improvement with basketball practice and only continuing this if this is clinically helpful Will follow-up with patient on Saturday prior to the possible game

## 2014-09-29 ENCOUNTER — Encounter (HOSPITAL_COMMUNITY): Payer: Self-pay | Admitting: Emergency Medicine

## 2014-09-29 ENCOUNTER — Emergency Department (HOSPITAL_COMMUNITY)
Admission: EM | Admit: 2014-09-29 | Discharge: 2014-09-29 | Disposition: A | Discharge status: Home / Self Care | Payer: Managed Care, Other (non HMO) | Source: Home / Self Care | Attending: Emergency Medicine | Admitting: Emergency Medicine

## 2014-09-29 ENCOUNTER — Emergency Department (HOSPITAL_COMMUNITY): Payer: Managed Care, Other (non HMO)

## 2014-09-29 ENCOUNTER — Emergency Department (HOSPITAL_COMMUNITY)
Admission: EM | Admit: 2014-09-29 | Discharge: 2014-09-29 | Disposition: A | Discharge status: Home / Self Care | Payer: Managed Care, Other (non HMO) | Attending: Emergency Medicine | Admitting: Emergency Medicine

## 2014-09-29 DIAGNOSIS — Z792 Long term (current) use of antibiotics: Secondary | ICD-10-CM | POA: Diagnosis not present

## 2014-09-29 DIAGNOSIS — Z79899 Other long term (current) drug therapy: Secondary | ICD-10-CM | POA: Diagnosis not present

## 2014-09-29 DIAGNOSIS — R55 Syncope and collapse: Secondary | ICD-10-CM | POA: Insufficient documentation

## 2014-09-29 DIAGNOSIS — J111 Influenza due to unidentified influenza virus with other respiratory manifestations: Secondary | ICD-10-CM | POA: Diagnosis not present

## 2014-09-29 DIAGNOSIS — R69 Illness, unspecified: Secondary | ICD-10-CM

## 2014-09-29 DIAGNOSIS — B349 Viral infection, unspecified: Secondary | ICD-10-CM

## 2014-09-29 DIAGNOSIS — R0602 Shortness of breath: Secondary | ICD-10-CM | POA: Diagnosis present

## 2014-09-29 DIAGNOSIS — R112 Nausea with vomiting, unspecified: Secondary | ICD-10-CM

## 2014-09-29 LAB — BASIC METABOLIC PANEL
Anion gap: 12 (ref 5–15)
BUN: 14 mg/dL (ref 6–23)
CO2: 21 mmol/L (ref 19–32)
CREATININE: 0.86 mg/dL (ref 0.50–1.10)
Calcium: 10 mg/dL (ref 8.4–10.5)
Chloride: 106 mmol/L (ref 96–112)
GFR calc Af Amer: >90 mL/min (ref >90)
GFR calc non Af Amer: >90 mL/min (ref >90)
Glucose, Bld: 106 mg/dL — ABNORMAL HIGH (ref 70–99)
Potassium: 3.6 mmol/L (ref 3.5–5.1)
Sodium: 139 mmol/L (ref 135–145)

## 2014-09-29 LAB — CBC
HCT: 40.7 % (ref 36.0–46.0)
Hemoglobin: 14 g/dL (ref 12.0–15.0)
MCH: 31.2 pg (ref 26.0–34.0)
MCHC: 34.4 g/dL (ref 30.0–36.0)
MCV: 90.6 fL (ref 78.0–100.0)
Platelets: 283 10*3/uL (ref 150–400)
RBC: 4.49 MIL/uL (ref 3.87–5.11)
RDW: 12.8 % (ref 11.5–15.5)
WBC: 13.8 10*3/uL — ABNORMAL HIGH (ref 4.0–10.5)

## 2014-09-29 LAB — I-STAT TROPONIN, ED: Troponin i, poc: 0 ng/mL (ref 0.00–0.08)

## 2014-09-29 LAB — BRAIN NATRIURETIC PEPTIDE: B Natriuretic Peptide: 26.5 pg/mL (ref 0.0–100.0)

## 2014-09-29 MED ORDER — ALBUTEROL SULFATE HFA 108 (90 BASE) MCG/ACT IN AERS
2.0000 | INHALATION_SPRAY | RESPIRATORY_TRACT | Status: AC
  Administered 2014-09-29: 2 via RESPIRATORY_TRACT
  Filled 2014-09-29: qty 6.7

## 2014-09-29 MED ORDER — NAPROXEN 500 MG PO TABS: 500.0000 mg | ORAL_TABLET | Freq: Two times a day (BID) | ORAL | Status: DC

## 2014-09-29 MED ORDER — SODIUM CHLORIDE 0.9 % IV BOLUS (SEPSIS)
1000.0000 mL | Freq: Once | INTRAVENOUS | Status: AC
  Administered 2014-09-29: 1000 mL via INTRAVENOUS

## 2014-09-29 MED ORDER — ONDANSETRON 4 MG PO TBDP
4.0000 mg | ORAL_TABLET | Freq: Once | ORAL | Status: DC
  Filled 2014-09-29: qty 1

## 2014-09-29 MED ORDER — KETOROLAC TROMETHAMINE 60 MG/2ML IM SOLN
60.0000 mg | Freq: Once | INTRAMUSCULAR | Status: AC
  Administered 2014-09-29: 60 mg via INTRAMUSCULAR
  Filled 2014-09-29: qty 2

## 2014-09-29 MED ORDER — ONDANSETRON 8 MG PO TBDP
8.0000 mg | ORAL_TABLET | Freq: Once | ORAL | Status: AC
  Administered 2014-09-29: 8 mg via ORAL
  Filled 2014-09-29: qty 1

## 2014-09-29 MED ORDER — ONDANSETRON HCL 4 MG PO TABS: 4.0000 mg | ORAL_TABLET | Freq: Four times a day (QID) | ORAL | Status: DC

## 2014-09-29 MED ORDER — METOCLOPRAMIDE HCL 5 MG/ML IJ SOLN
10.0000 mg | Freq: Once | INTRAMUSCULAR | Status: AC
  Administered 2014-09-29: 10 mg via INTRAVENOUS
  Filled 2014-09-29: qty 2

## 2014-09-29 MED ORDER — BENZONATATE 100 MG PO CAPS: 100.0000 mg | ORAL_CAPSULE | Freq: Three times a day (TID) | ORAL | Status: DC

## 2014-09-29 MED ORDER — OSELTAMIVIR PHOSPHATE 75 MG PO CAPS: 75.0000 mg | ORAL_CAPSULE | Freq: Two times a day (BID) | ORAL | Status: DC

## 2014-09-29 MED ORDER — ONDANSETRON HCL 4 MG/2ML IJ SOLN
4.0000 mg | Freq: Once | INTRAMUSCULAR | Status: AC
  Administered 2014-09-29: 4 mg via INTRAVENOUS
  Filled 2014-09-29: qty 2

## 2014-09-29 NOTE — Progress Notes (Signed)
Pt states she will follow up with pcp in [place], Dr Geoffery SpruceLineberger

## 2014-09-29 NOTE — Discharge Instructions (Signed)
Return to the emergency room with worsening of symptoms, new symptoms or with symptoms that are concerning, , especially fevers, stiff neck, worsening headache, nausea/vomiting, visual changes or slurred speech, chest pain, shortness of breath, cough with thick colored mucous or blood Drink plenty of fluids with electrolytes especially Gatorade. OTC cold medications such as mucinex, nyquil, dayquil are recommended. Chloraseptic for sore throat. BRAT diet: bananas, rice, applesauce, toast zofran as needed for nausea. Read below information and follow recommendations. Nausea and Vomiting Nausea is a sick feeling that often comes before throwing up (vomiting). Vomiting is a reflex where stomach contents come out of your mouth. Vomiting can cause severe loss of body fluids (dehydration). Children and elderly adults can become dehydrated quickly, especially if they also have diarrhea. Nausea and vomiting are symptoms of a condition or disease. It is important to find the cause of your symptoms. CAUSES   Direct irritation of the stomach lining. This irritation can result from increased acid production (gastroesophageal reflux disease), infection, food poisoning, taking certain medicines (such as nonsteroidal anti-inflammatory drugs), alcohol use, or tobacco use.  Signals from the brain.These signals could be caused by a headache, heat exposure, an inner ear disturbance, increased pressure in the brain from injury, infection, a tumor, or a concussion, pain, emotional stimulus, or metabolic problems.  An obstruction in the gastrointestinal tract (bowel obstruction).  Illnesses such as diabetes, hepatitis, gallbladder problems, appendicitis, kidney problems, cancer, sepsis, atypical symptoms of a heart attack, or eating disorders.  Medical treatments such as chemotherapy and radiation.  Receiving medicine that makes you sleep (general anesthetic) during surgery. DIAGNOSIS Your caregiver may ask for  tests to be done if the problems do not improve after a few days. Tests may also be done if symptoms are severe or if the reason for the nausea and vomiting is not clear. Tests may include:  Urine tests.  Blood tests.  Stool tests.  Cultures (to look for evidence of infection).  X-rays or other imaging studies. Test results can help your caregiver make decisions about treatment or the need for additional tests. TREATMENT You need to stay well hydrated. Drink frequently but in small amounts.You may wish to drink water, sports drinks, clear broth, or eat frozen ice pops or gelatin dessert to help stay hydrated.When you eat, eating slowly may help prevent nausea.There are also some antinausea medicines that may help prevent nausea. HOME CARE INSTRUCTIONS   Take all medicine as directed by your caregiver.  If you do not have an appetite, do not force yourself to eat. However, you must continue to drink fluids.  If you have an appetite, eat a normal diet unless your caregiver tells you differently.  Eat a variety of complex carbohydrates (rice, wheat, potatoes, bread), lean meats, yogurt, fruits, and vegetables.  Avoid high-fat foods because they are more difficult to digest.  Drink enough water and fluids to keep your urine clear or pale yellow.  If you are dehydrated, ask your caregiver for specific rehydration instructions. Signs of dehydration may include:  Severe thirst.  Dry lips and mouth.  Dizziness.  Dark urine.  Decreasing urine frequency and amount.  Confusion.  Rapid breathing or pulse. SEEK IMMEDIATE MEDICAL CARE IF:   You have blood or brown flecks (like coffee grounds) in your vomit.  You have black or bloody stools.  You have a severe headache or stiff neck.  You are confused.  You have severe abdominal pain.  You have chest pain or trouble breathing.  You  do not urinate at least once every 8 hours.  You develop cold or clammy skin.  You  continue to vomit for longer than 24 to 48 hours.  You have a fever. MAKE SURE YOU:   Understand these instructions.  Will watch your condition.  Will get help right away if you are not doing well or get worse. Document Released: 05/21/2005 Document Revised: 08/13/2011 Document Reviewed: 10/18/2010 [facility] Patient Information 2015 [place], [place]. This information is not intended to replace advice given to you by your health care provider. Make sure you discuss any questions you have with your health care provider.

## 2014-09-29 NOTE — Discharge Instructions (Signed)
Please call your doctor for a followup appointment within 24-48 hours. When you talk to your doctor please let them know that you were seen in the emergency department and have them acquire all of your records so that they can discuss the findings with you and formulate a treatment plan to fully care for your new and ongoing problems. ° °

## 2014-09-29 NOTE — ED Notes (Signed)
Pt was d/c from ED. When walking to car pt's legs became weak and she fell. Pt unsure if she passed out. Also reports new onset of bilateral arm tingling. Otherwise symptoms the same

## 2014-09-29 NOTE — ED Notes (Addendum)
Pt reports she began to have productive cough yesterday. Began to have SOB at 0830 this am along with sweating and emesis. Pt shaky in triage.  No hx of recent travel.

## 2014-09-29 NOTE — ED Provider Notes (Signed)
CSN: [id number]     Arrival date & time 09/29/14  1125 History   First MD Initiated Contact with Patient 09/29/14 1326     Chief Complaint  Patient presents with  . Near Syncope  . Fall     (Consider location/radiation/quality/duration/timing/severity/associated sxs/prior Treatment) HPI  Chelsea Frazier is a 19 y.o. female presenting with near syncopal episode while ambulating to the car. Patient was evaluated in the emergency room earlier today and diagnosed with flulike illness and emesis. Patient reported no change in her symptoms but felt nauseated with bilateral arm numbness tingling and went to vomit and fell to her knees. She denies loss of consciousness or any head injury. She reported initial numbness, tingling but no weakness. No headache, visual changes or slurred speech.   History reviewed. No pertinent past medical history. History reviewed. No pertinent past surgical history. History reviewed. No pertinent family history. History  Substance Use Topics  . Smoking status: Never Smoker   . Smokeless tobacco: Not on file  . Alcohol Use: No   OB History    Gravida Para Term Preterm AB TAB SAB Ectopic Multiple Living   0 0 0 0 0 0 0 0 0 0      Review of Systems 10 Systems reviewed and are negative for acute change except as noted in the HPI.    Allergies  Review of patient's allergies indicates no known allergies.  Home Medications   Prior to Admission medications   Medication Sig Start Date End Date Taking? Authorizing Provider  acetaminophen (TYLENOL) 500 MG tablet Take 500 mg by mouth every 6 (six) hours as needed for mild pain or fever.   Yes Historical Provider, MD  albuterol (PROVENTIL HFA;VENTOLIN HFA) 108 (90 BASE) MCG/ACT inhaler Inhale 2 puffs into the lungs every 4 (four) hours as needed for wheezing or shortness of breath (cough, shortness of breath or wheezing.). 06/17/14  Yes Deanna M Didiano, DO  azithromycin (ZITHROMAX) 500 MG tablet Take 1 tablet (500  mg total) by mouth daily. Patient not taking: Reported on 09/29/2014 06/20/14   Deanna M Didiano, DO  benzonatate (TESSALON) 100 MG capsule Take 1 capsule (100 mg total) by mouth every 8 (eight) hours. Patient not taking: Reported on 09/29/2014 09/29/14   Eber HongBrian Miller, MD  Chlorphen-Pseudoephed-APAP (FLU/COLD MEDICINE PO) Take 2 tablets by mouth once.    Historical Provider, MD  naproxen (NAPROSYN) 500 MG tablet Take 1 tablet (500 mg total) by mouth 2 (two) times daily with a meal. Patient not taking: Reported on 09/29/2014 09/29/14   Eber HongBrian Miller, MD  ondansetron (ZOFRAN) 4 MG tablet Take 1 tablet (4 mg total) by mouth every 6 (six) hours. 09/29/14   Oswaldo ConroyVictoria Skiler Tye, PA-C  oseltamivir (TAMIFLU) 75 MG capsule Take 1 capsule (75 mg total) by mouth every 12 (twelve) hours. Patient not taking: Reported on 09/29/2014 09/29/14   Eber HongBrian Miller, MD   BP 125/70 mmHg  Pulse 57  Temp(Src) 97.6 F (36.4 C) (Oral)  Resp 16  SpO2 100%  LMP 09/28/2014 Physical Exam  Constitutional: She appears well-developed and well-nourished. No distress.  HENT:  Head: Normocephalic and atraumatic.  Mouth/Throat: Oropharynx is clear and moist.  Eyes: Conjunctivae and EOM are normal. Pupils are equal, round, and reactive to light. Right eye exhibits no discharge. Left eye exhibits no discharge.  Neck: Normal range of motion. Neck supple.  No nuchal rigidity  Cardiovascular: Normal rate and regular rhythm.   Pulmonary/Chest: Effort normal and breath sounds normal. No respiratory distress. She  has no wheezes.  Abdominal: Soft. Bowel sounds are normal. She exhibits no distension. There is no tenderness.  Neurological: She is alert. No cranial nerve deficit. Coordination normal.  Speech is clear and goal oriented. Strength 5/5 in upper and lower extremities. Sensation intact. No pronator drift. Normal gait.   Skin: Skin is warm and dry. She is not diaphoretic.  Nursing note and vitals reviewed.   ED Course  Procedures  (including critical care time) Labs Review Labs Reviewed - No data to display  Imaging Review Dg Chest 2 View (if Patient Has Fever And/or Copd)  09/29/2014   CLINICAL DATA:  Severe shortness of breath beginning this morning.  EXAM: CHEST  2 VIEW  COMPARISON:  None.  FINDINGS: Heart size and mediastinal contours are within normal limits. Both lungs are clear. Visualized skeletal structures are unremarkable.  IMPRESSION: Normal exam.   Electronically Signed   By: Drusilla Kanner M.D.   On: 09/29/2014 10:47     EKG Interpretation None      Meds given in ED:  Medications  ondansetron (ZOFRAN-ODT) disintegrating tablet 8 mg (8 mg Oral Given 09/29/14 1232)  sodium chloride 0.9 % bolus 1,000 mL (0 mLs Intravenous Stopped 09/29/14 1616)  metoCLOPramide (REGLAN) injection 10 mg (10 mg Intravenous Given 09/29/14 1449)  ondansetron (ZOFRAN) injection 4 mg (4 mg Intravenous Given 09/29/14 1604)    Discharge Medication List as of 09/29/2014  4:12 PM        MDM   Final diagnoses:  Non-intractable vomiting with nausea, vomiting of unspecified type  Viral syndrome  Near syncope   Pt reports near syncopal episode while walking to the car. No LOC. VSS. Neurological exam without deficits. Pt with post tussive emesis. No abdominal tenderness. Pt otherwise healthy female with a few episodes of emesis. Non bloody nonbilious. No indication for repeat labs for earlier today. Pt given zofran and reglan with improvement of her nausea and is tolerating fluids without difficulty. Pt states she feels much better. Discharged with zofran and PCP follow up.  Discussed return precautions with patient. Discussed all results and patient verbalizes understanding and agrees with plan.   Oswaldo Conroy, PA-C 09/30/14 1610  Linwood Dibbles, MD 09/30/14 [phone]

## 2014-09-29 NOTE — ED Notes (Signed)
Pt tolerates well 222 mL of ginger ale, although she reports mild nausea, she states it is better that it was. Ready for dx with Zofran Rx.

## 2014-09-29 NOTE — ED Provider Notes (Signed)
CSN: [id number]     Arrival date & time 09/29/14  [id number] History   First MD Initiated Contact with Patient 09/29/14 1044     Chief Complaint  Patient presents with  . Shortness of Breath  . Emesis     (Consider location/radiation/quality/duration/timing/severity/associated sxs/prior Treatment) HPI Comments: The patient is a 19 year old female who presents from home after being sick for the last 2 days with cough which is nonproductive, occasionally with some phlegm, diffuse body aches, chills, sore throat, nasal congestion. She noted that today she had some difficulty breathing because of pain which restricted her ability to breathe in. She denies any swelling of the lower extremities, trauma, travel, immobilization, she does not use oral contraceptive pills, has not had any recent surgery. Her roommate who presents with her states that she had similar symptoms about 4 days ago but hers resolved spontaneously. The patient denies diarrhea, dysuria, swelling, rashes, weakness, numbness, blurred vision, headache.  Patient is a 19 y.o. female presenting with shortness of breath and vomiting. The history is provided by the patient.  Shortness of Breath Associated symptoms: vomiting   Emesis   History reviewed. No pertinent past medical history. History reviewed. No pertinent past surgical history. History reviewed. No pertinent family history. History  Substance Use Topics  . Smoking status: Never Smoker   . Smokeless tobacco: Not on file  . Alcohol Use: No   OB History    Gravida Para Term Preterm AB TAB SAB Ectopic Multiple Living   0 0 0 0 0 0 0 0 0 0      Review of Systems  Respiratory: Positive for shortness of breath.   Gastrointestinal: Positive for vomiting.  All other systems reviewed and are negative.     Allergies  Review of patient's allergies indicates no known allergies.  Home Medications   Prior to Admission medications   Medication Sig Start Date End Date  Taking? Authorizing Provider  albuterol (PROVENTIL HFA;VENTOLIN HFA) 108 (90 BASE) MCG/ACT inhaler Inhale 2 puffs into the lungs every 4 (four) hours as needed for wheezing or shortness of breath (cough, shortness of breath or wheezing.). 06/17/14   Deanna M Didiano, DO  azithromycin (ZITHROMAX) 500 MG tablet Take 1 tablet (500 mg total) by mouth daily. 06/20/14   Deanna M Didiano, DO  benzonatate (TESSALON) 100 MG capsule Take 1 capsule (100 mg total) by mouth every 8 (eight) hours. 09/29/14   Eber HongBrian Wilburta Milbourn, MD  Chlorphen-Pseudoephed-APAP (FLU/COLD MEDICINE PO) Take 2 tablets by mouth once.    Historical Provider, MD  naproxen (NAPROSYN) 500 MG tablet Take 1 tablet (500 mg total) by mouth 2 (two) times daily with a meal. 09/29/14   Eber HongBrian Cerinity Zynda, MD  ondansetron (ZOFRAN) 4 MG tablet Take 4 mg by mouth every 8 (eight) hours as needed for nausea or vomiting.    Historical Provider, MD  oseltamivir (TAMIFLU) 75 MG capsule Take 1 capsule (75 mg total) by mouth every 12 (twelve) hours. 09/29/14   Eber HongBrian Autry Droege, MD   BP 112/66 mmHg  Pulse 68  Temp(Src) 98 F (36.7 C) (Oral)  Resp 18  SpO2 100%  LMP 09/28/2014 Physical Exam  Constitutional: She appears well-developed and well-nourished. No distress.  HENT:  Head: Normocephalic and atraumatic.  Mouth/Throat: Oropharynx is clear and moist. No oropharyngeal exudate.  Tympanic membranes clear bilaterally, nasal passages with clear rhinorrhea, oropharynx with mild erythema but no exudate asymmetry or hypertrophy, mucous members are moist  Eyes: Conjunctivae and EOM are normal. Pupils are equal, round,  and reactive to light. Right eye exhibits no discharge. Left eye exhibits no discharge. No scleral icterus.  Neck: Normal range of motion. Neck supple. No JVD present. No thyromegaly present.  Very supple neck, shotty anterior cervical lymph nodes  Cardiovascular: Normal rate, regular rhythm, normal heart sounds and intact distal pulses.  Exam reveals no gallop  and no friction rub.   No murmur heard. Pulmonary/Chest: Effort normal and breath sounds normal. No respiratory distress. She has no wheezes. She has no rales.  Abdominal: Soft. Bowel sounds are normal. She exhibits no distension and no mass. There is no tenderness.  Musculoskeletal: Normal range of motion. She exhibits no edema or tenderness.  Lymphadenopathy:    She has no cervical adenopathy.  Neurological: She is alert. Coordination normal.  Skin: Skin is warm and dry. No rash noted. No erythema.  Psychiatric: She has a normal mood and affect. Her behavior is normal.  Nursing note and vitals reviewed.   ED Course  Procedures (including critical care time) Labs Review Labs Reviewed  CBC - Abnormal; Notable for the following:    WBC 13.8 (*)    All other components within normal limits  BASIC METABOLIC PANEL  BRAIN NATRIURETIC PEPTIDE  I-STAT TROPOININ, ED    Imaging Review Dg Chest 2 View (if Patient Has Fever And/or Copd)  09/29/2014   CLINICAL DATA:  Severe shortness of breath beginning this morning.  EXAM: CHEST  2 VIEW  COMPARISON:  None.  FINDINGS: Heart size and mediastinal contours are within normal limits. Both lungs are clear. Visualized skeletal structures are unremarkable.  IMPRESSION: Normal exam.   Electronically Signed   By: Drusilla Kanner M.D.   On: 09/29/2014 10:47     EKG Interpretation   Date/Time:  Wednesday September 29 2014 10:12:29 EDT Ventricular Rate:  67 PR Interval:    QRS Duration: 90 QT Interval:  383 QTC Calculation: 404 R Axis:   89 Text Interpretation:  Normal sinus rhythm Nonspecific T wave abnormality  No old tracing to compare Confirmed by Alexey Rhoads  MD, Arden Tinoco ([id number]) on  09/29/2014 10:59:57 AM      MDM   Final diagnoses:  Influenza-like illness    The patient has normal cardiac and pulmonary exams, her chest x-ray is negative for acute infiltrates as ordered by nursing, vital signs are totally normal, see below, will treat with  symptom Medicare for bronchitis upper respiratory infection, possibly flulike symptoms. Patient stable for discharge, no indication for bacterial coverage or further testing. Leukocytosis likely secondary to underlying infection.  Filed Vitals:   09/29/14 1008 09/29/14 1051  BP: 130/106 112/66  Pulse: 95 68  Temp:  98 F (36.7 C)  TempSrc: Oral Oral  Resp: 20 18  SpO2: 96% 100%     Meds given in ED:  Medications  albuterol (PROVENTIL HFA;VENTOLIN HFA) 108 (90 BASE) MCG/ACT inhaler 2 puff (2 puffs Inhalation Given 09/29/14 1102)  ketorolac (TORADOL) injection 60 mg (60 mg Intramuscular Given 09/29/14 1102)    New Prescriptions   BENZONATATE (TESSALON) 100 MG CAPSULE    Take 1 capsule (100 mg total) by mouth every 8 (eight) hours.   NAPROXEN (NAPROSYN) 500 MG TABLET    Take 1 tablet (500 mg total) by mouth 2 (two) times daily with a meal.   OSELTAMIVIR (TAMIFLU) 75 MG CAPSULE    Take 1 capsule (75 mg total) by mouth every 12 (twelve) hours.        Eber Hong, MD 09/29/14 [phone]

## 2014-10-01 ENCOUNTER — Ambulatory Visit (INDEPENDENT_AMBULATORY_CARE_PROVIDER_SITE_OTHER): Payer: Managed Care, Other (non HMO) | Admitting: Physician Assistant

## 2014-10-01 VITALS — BP 128/76 | HR 65 | Temp 98.0°F | Resp 24

## 2014-10-01 DIAGNOSIS — R0602 Shortness of breath: Secondary | ICD-10-CM | POA: Diagnosis not present

## 2014-10-01 DIAGNOSIS — G43A0 Cyclical vomiting, not intractable: Secondary | ICD-10-CM | POA: Diagnosis not present

## 2014-10-01 DIAGNOSIS — R079 Chest pain, unspecified: Secondary | ICD-10-CM

## 2014-10-01 DIAGNOSIS — R1115 Cyclical vomiting syndrome unrelated to migraine: Secondary | ICD-10-CM

## 2014-10-01 LAB — COMPLETE METABOLIC PANEL WITH GFR
ALT: 18 U/L (ref 0–35)
AST: 22 U/L (ref 0–37)
Albumin: 4.9 g/dL (ref 3.5–5.2)
Alkaline Phosphatase: 41 U/L (ref 39–117)
BUN: 13 mg/dL (ref 6–23)
CO2: 18 mEq/L — ABNORMAL LOW (ref 19–32)
CREATININE: 0.75 mg/dL (ref 0.50–1.10)
Calcium: 10.1 mg/dL (ref 8.4–10.5)
Chloride: 103 mEq/L (ref 96–112)
GFR, Est Non African American: >89 mL/min
Glucose, Bld: 86 mg/dL (ref 70–99)
Potassium: 3.8 mEq/L (ref 3.5–5.3)
Sodium: 139 mEq/L (ref 135–145)
Total Bilirubin: 0.6 mg/dL (ref 0.2–1.2)
Total Protein: 7.4 g/dL (ref 6.0–8.3)

## 2014-10-01 LAB — POCT CBC
GRANULOCYTE PERCENT: 90.3 % — AB (ref 37–80)
HEMATOCRIT: 39.1 % (ref 37.7–47.9)
HEMOGLOBIN: 12.3 g/dL (ref 12.2–16.2)
Lymph, poc: 1.2 (ref 0.6–3.4)
MCH, POC: 28.6 pg (ref 27–31.2)
MCHC: 31.5 g/dL — AB (ref 31.8–35.4)
MCV: 90.9 fL (ref 80–97)
MID (cbc): 0.1 (ref 0–0.9)
MPV: 8.7 fL (ref 0–99.8)
POC Granulocyte: 12.3 — AB (ref 2–6.9)
POC LYMPH PERCENT: 8.9 %L — AB (ref 10–50)
POC MID %: 0.8 % (ref 0–12)
Platelet Count, POC: 220 10*3/uL (ref 142–424)
RBC: 4.3 M/uL (ref 4.04–5.48)
RDW, POC: 14.3 %
WBC: 13.6 10*3/uL — AB (ref 4.6–10.2)

## 2014-10-01 LAB — POCT SEDIMENTATION RATE: POCT SED RATE: 14 mm/hr (ref 0–22)

## 2014-10-01 LAB — LIPASE: Lipase: 19 U/L (ref 0–75)

## 2014-10-01 MED ORDER — ALPRAZOLAM 0.25 MG PO TABS: 0.2500 mg | ORAL_TABLET | Freq: Two times a day (BID) | ORAL | Status: DC | PRN

## 2014-10-01 MED ORDER — ONDANSETRON HCL 4 MG/2ML IJ SOLN
4.0000 mg | Freq: Once | INTRAMUSCULAR | Status: AC
  Administered 2014-10-01: 4 mg via INTRAMUSCULAR

## 2014-10-01 MED ORDER — ALPRAZOLAM 0.25 MG PO TABS
0.2500 mg | ORAL_TABLET | Freq: Once | ORAL | Status: AC
  Administered 2014-10-01: 0.25 mg via ORAL

## 2014-10-01 MED ORDER — PROMETHAZINE HCL 25 MG/ML IJ SOLN
25.0000 mg | Freq: Once | INTRAMUSCULAR | Status: AC
  Administered 2014-10-01: 25 mg via INTRAMUSCULAR

## 2014-10-01 MED ORDER — PROMETHAZINE HCL 25 MG PO TABS: 25.0000 mg | ORAL_TABLET | Freq: Three times a day (TID) | ORAL | Status: DC | PRN

## 2014-10-01 NOTE — Patient Instructions (Signed)
Drink and stay hydrated.  Eat bland food.  Do not drink ETOH or smoke.  Try to stay mentally calm.  Go ahead and start your OCP.   Take the nausea medication when you need it. Take the medication for anxiety also when you need it. Go ahead and make an appt with you PCP for when you get home.

## 2014-10-01 NOTE — Progress Notes (Signed)
Subjective:    Patient ID: Chelsea Frazier, female    DOB: 18-Nov-1995, 19 y.o.   MRN: [id number]  HPI  Pt presents to clinic with nausea and vomiting with a slight cough that has been going on for 4 days.  Upon further questioning she has had similar symptoms intermittently throughout the last year.  She is complaining of nausea, vomiting (which she cannot stop currently), shaking, generalized fatigue, cough, shortness of breath, chest pain and tingling in her hands that has been present on and off for a couple of days. Denies any diarrhea. She says that her symptoms are slightly worse today than they were the other day when she went to the ED.   She says the nausea has been occurring on and off for one month, and she has been "making appointments" to evaluate for other GI issues that may be contributing to her nausea. She thought she might have Crohn's but she was evaluated by a GI specialist who could not find the cause of her complaints. She had an upper endoscopy and colonoscopy done within the last year in regards to the nausea, and reports that they were both normal. She thinks that the nausea originally started in relation to her menstrual cycle, and she thought her menses was causing the nausea and vomiting, she has no cramping with her menses. She saw a GYN, who prescribed birth control for symptoms, but she never started taking the prescription. After her meg GI work-up she saw a different GYN who also prescribed birth control, and told her to start this prescription the Sunday following her most recent menstrual cycle. She is planning on starting the birth control 10/03/14.  She is sexually active with females only.  Her current episode started 2 days after her menses started and she is no longer bleeding currently.  She was seen in the Emergency Department 09/29/14 for her current episode of symptoms. She was diagnosed with the flu, but they never tested her for it. She was given Zofran IV in the  ED which seemed to temporarily help her nausea. She was also given a prescription for Tamiflu but was unable to fill the prescription due to changes with her insurance. She has not taken the Tamiflu, but has taken cough medicine that she was prescribed. She was discharged from the ED, and when she was walking to her car she almost passed out, and was then re-admitted to the ED. She had an EKG, troponin, and chest x-ray done in the ED. She was discharged later that afternoon.   Yesterday after her ER visit she was feeling better, and was able to eat pizza last night for dinner and feel fine.  She was able to keep that down and did not have any vomiting or nausea episodes afterwards. This morning she woke up feeling ill, nauseous and had another episode of vomiting. She describes her vomiting as more of a dry heave than actually being able to vomit any substance. She had tea this morning and was also able to keep that down. She has not had anything else to eat today. She has never had heartburn symptoms throughout this episode or previous episodes.   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 Her final exams are next week, and being sick and not feeling well only seems to make her more stressed. She does not report eating any different in front of friends than when  he is by herself. She does not report exercising more than usual or retching after she works out or eats.  She has no depressed mood or lack of interest in things that she enjoys.  She does not feel like she is an anxious person.  Her mother does have panic attacks at times.  Otherwise no mental health issues in her family.   Review of Systems  Constitutional: Positive for fever, chills, diaphoresis and fatigue.  HENT: Positive for congestion, rhinorrhea, sore throat and tinnitus. Negative for ear pain and postnasal drip.   Eyes: Negative for photophobia, pain and visual  disturbance.  Respiratory: Positive for cough and shortness of breath.   Cardiovascular: Positive for chest pain. Negative for palpitations.  Gastrointestinal: Positive for nausea, vomiting and abdominal pain. Negative for diarrhea.  Musculoskeletal: Positive for myalgias.  Neurological: Positive for dizziness.       Objective:   Physical Exam  Constitutional: She is oriented to person, place, and time. She appears well-developed and well-nourished. She appears distressed (at beginning of the visit but it improved prior to her discharge).  BP 122/70 mmHg  Pulse 62  Temp(Src) 98 F (36.7 C)  Resp 24  SpO2 99%  LMP 09/28/2014  HENT:  Head: Normocephalic and atraumatic.  Right Ear: Hearing, tympanic membrane, external ear and ear canal normal.  Left Ear: Hearing, tympanic membrane, external ear and ear canal normal.  Nose: Nose normal.  Mouth/Throat: Uvula is midline and mucous membranes are normal. Posterior oropharyngeal erythema present.  Eyes: Conjunctivae are normal. Pupils are equal, round, and reactive to light. No scleral icterus.  Neck: Neck supple.  Cardiovascular: Normal rate, regular rhythm and normal heart sounds.  Exam reveals no gallop and no friction rub.   No murmur heard. Pulmonary/Chest: Effort normal and breath sounds normal. She has no wheezes. She has no rhonchi. She has no rales.  Abdominal: Soft. Bowel sounds are normal. She exhibits no mass. There is no tenderness.  Lymphadenopathy:    She has no cervical adenopathy.       Right: No supraclavicular adenopathy present.       Left: No supraclavicular adenopathy present.  Neurological: She is alert and oriented to person, place, and time.  Skin: Skin is warm and dry.  Psychiatric: Her mood appears anxious (slightly).  Fidgeting in the chair which resolved towards the end of the visit and when I talk directly at her requiring her answer questions.   Results for orders placed or performed in visit on 10/01/14    POCT SEDIMENTATION RATE  Result Value Ref Range   POCT SED RATE 14 0 - 22 mm/hr  POCT CBC  Result Value Ref Range   WBC 13.6 (A) 4.6 - 10.2 K/uL   Lymph, poc 1.2 0.6 - 3.4   POC LYMPH PERCENT 8.9 (A) 10 - 50 %L   MID (cbc) 0.1 0 - 0.9   POC MID % 0.8 0 - 12 %M   POC Granulocyte 12.3 (A) 2 - 6.9   Granulocyte percent 90.3 (A) 37 - 80 %G   RBC 4.30 4.04 - 5.48 M/uL   Hemoglobin 12.3 12.2 - 16.2 g/dL   HCT, POC 39.1 37.7 - 47.9 %   MCV 90.9 80 - 97 fL   MCH, POC 28.6 27 - 31.2 pg   MCHC 31.5 (A) 31.8 - 35.4 g/dL   RDW, POC 14.3 %   Platelet Count, POC 220 142 - 424 K/uL   MPV 8.7 0 - 99.8 fL  Orthostatic vital signs: Lying 123/71 mmHg  Sitting 138/77 mmHg  Standing 128/76 mmHg IV started and 500 cc normal saline given over 4 hours - pt felt better and was ready to leave after this much fluid.  IM Zofran - only mild improvement in her nausea. IM phenergan - improved nausea after about 15 mins.  Spent >69mns with the patient in the room with >50% in counseling.    Assessment & Plan:  She was seen in the ED and diagnosed with the flu. Not convinced that explains her symptoms. Started with IM Zofran 4 mg that did not seem to resolve her symptoms. She felt that IV fluids would help, so we started an IV and gave 500 cc fluid over 4 hours. Checked a CMP to rule out possible liver or gallbladder issues. ESR to rule out inflammatory process that would need a further workup. Lipase to rule out pancreatitis causing the nausea. Discussed with patient with a neg GI work-up and no obvious Gyn cause while waiting on the labs but the ones so far basically normal (the increased WBC is most likely from stress reaction of continuous retching) the possibility of stress and anxiety become more liekly the cause of her current symptoms. She indicated anxiety was a likely contributor to her symptoms, so we gave Xanax 0.25 mg tablet in clinic. She subsequently felt better and more calm. Her nausea was only  40% improved at this time, so gave IM Phenergan 25 mg. Gave her a prescription for Phenergan and Xanax to get her through the next week of exams, until she is able to get home and make an appointment with her primary care physician.  She will RTC if any problems. We discussed that her chest pain was most likely resulting from significant retching and acidic emesis.  SOB seemed worse as the nausea was worse and with a normal work-up in the ED last night these were not further investigated.  After the Xanax they were both better but she still stated the nausea was always the worse problem for her.  I spoke to both her mother and father on the phone regarding her current situation.  1. Non-intractable cyclical vomiting with nausea - ondansetron (ZOFRAN) injection 4 mg; Inject 2 mLs (4 mg total) into the muscle once. - POCT SEDIMENTATION RATE - COMPLETE METABOLIC PANEL WITH GFR - Lipase - POCT CBC - ALPRAZolam (XANAX) tablet 0.25 mg; Take 1 tablet (0.25 mg total) by mouth once. - promethazine (PHENERGAN) injection 25 mg; Inject 1 mL (25 mg total) into the muscle once. - promethazine (PHENERGAN) 25 MG tablet; Take 1 tablet (25 mg total) by mouth every 8 (eight) hours as needed for nausea or vomiting.  Dispense: 20 tablet; Refill: 0 - ALPRAZolam (XANAX) 0.25 MG tablet; Take 1 tablet (0.25 mg total) by mouth 2 (two) times daily as needed for anxiety.  Dispense: 15 tablet; Refill: 0  2. Chest pain, unspecified chest pain type Resolved. Chest pain was a combination of retching and anxiety. After receiving the Xanax and Phenergan, she no longer experienced chest pain, but ws still mildly nauseous.   3. SOB (shortness of breath) Resolved. Shortness of breath was a combination of retching and anxiety. After receiving the Xanax and Phenergan, she no longer experienced shortness of breath, but was still mildly nauseous.   SWindell HummingbirdPA-C  [facility] 10/01/2014  5:40 PM

## 2015-03-06 ENCOUNTER — Emergency Department (HOSPITAL_COMMUNITY)
Admission: EM | Admit: 2015-03-06 | Discharge: 2015-03-06 | Disposition: A | Discharge status: Home / Self Care | Payer: Managed Care, Other (non HMO) | Attending: Emergency Medicine | Admitting: Emergency Medicine

## 2015-03-06 ENCOUNTER — Emergency Department (HOSPITAL_COMMUNITY): Payer: Managed Care, Other (non HMO)

## 2015-03-06 ENCOUNTER — Encounter (HOSPITAL_COMMUNITY): Payer: Self-pay | Admitting: Emergency Medicine

## 2015-03-06 DIAGNOSIS — R109 Unspecified abdominal pain: Secondary | ICD-10-CM

## 2015-03-06 DIAGNOSIS — R1084 Generalized abdominal pain: Secondary | ICD-10-CM | POA: Diagnosis present

## 2015-03-06 DIAGNOSIS — Z3202 Encounter for pregnancy test, result negative: Secondary | ICD-10-CM | POA: Diagnosis not present

## 2015-03-06 DIAGNOSIS — R0682 Tachypnea, not elsewhere classified: Secondary | ICD-10-CM | POA: Diagnosis not present

## 2015-03-06 DIAGNOSIS — R110 Nausea: Secondary | ICD-10-CM

## 2015-03-06 DIAGNOSIS — Z79899 Other long term (current) drug therapy: Secondary | ICD-10-CM | POA: Insufficient documentation

## 2015-03-06 DIAGNOSIS — F419 Anxiety disorder, unspecified: Secondary | ICD-10-CM | POA: Insufficient documentation

## 2015-03-06 HISTORY — DX: Anxiety disorder, unspecified: F41.9

## 2015-03-06 LAB — CBC
HCT: 38.7 % (ref 36.0–46.0)
Hemoglobin: 12.8 g/dL (ref 12.0–15.0)
MCH: 30.8 pg (ref 26.0–34.0)
MCHC: 33.1 g/dL (ref 30.0–36.0)
MCV: 93 fL (ref 78.0–100.0)
PLATELETS: 299 10*3/uL (ref 150–400)
RBC: 4.16 MIL/uL (ref 3.87–5.11)
RDW: 12.7 % (ref 11.5–15.5)
WBC: 11.9 10*3/uL — AB (ref 4.0–10.5)

## 2015-03-06 LAB — COMPREHENSIVE METABOLIC PANEL
ALK PHOS: 37 U/L — AB (ref 38–126)
ALT: 22 U/L (ref 14–54)
AST: 36 U/L (ref 15–41)
Albumin: 4.9 g/dL (ref 3.5–5.0)
Anion gap: 16 — ABNORMAL HIGH (ref 5–15)
BILIRUBIN TOTAL: 0.7 mg/dL (ref 0.3–1.2)
BUN: 13 mg/dL (ref 6–20)
CALCIUM: 10.4 mg/dL — AB (ref 8.9–10.3)
CO2: 19 mmol/L — ABNORMAL LOW (ref 22–32)
CREATININE: 0.96 mg/dL (ref 0.44–1.00)
Chloride: 107 mmol/L (ref 101–111)
Glucose, Bld: 125 mg/dL — ABNORMAL HIGH (ref 65–99)
Potassium: 3.5 mmol/L (ref 3.5–5.1)
Sodium: 142 mmol/L (ref 135–145)
Total Protein: 7.3 g/dL (ref 6.5–8.1)

## 2015-03-06 LAB — URINALYSIS, ROUTINE W REFLEX MICROSCOPIC
BILIRUBIN URINE: NEGATIVE
GLUCOSE, UA: NEGATIVE mg/dL
Leukocytes, UA: NEGATIVE
NITRITE: NEGATIVE
PROTEIN: 30 mg/dL — AB
Specific Gravity, Urine: 1.027 (ref 1.005–1.030)
Urobilinogen, UA: 0.2 mg/dL (ref 0.0–1.0)
pH: 8 (ref 5.0–8.0)

## 2015-03-06 LAB — URINE MICROSCOPIC-ADD ON

## 2015-03-06 LAB — PREGNANCY, URINE: Preg Test, Ur: NEGATIVE

## 2015-03-06 LAB — LIPASE, BLOOD: Lipase: 20 U/L — ABNORMAL LOW (ref 22–51)

## 2015-03-06 MED ORDER — IOHEXOL 300 MG/ML  SOLN
50.0000 mL | Freq: Once | INTRAMUSCULAR | Status: AC | PRN
Start: 2015-03-06 — End: 2015-03-06
  Administered 2015-03-06: 25 mL via ORAL

## 2015-03-06 MED ORDER — SODIUM CHLORIDE 0.9 % IV BOLUS (SEPSIS)
1000.0000 mL | Freq: Once | INTRAVENOUS | Status: AC
  Administered 2015-03-06: 1000 mL via INTRAVENOUS

## 2015-03-06 MED ORDER — SODIUM CHLORIDE 0.9 % IV BOLUS (SEPSIS)
1000.0000 mL | Freq: Once | INTRAVENOUS | Status: AC
Start: 2015-03-06 — End: 2015-03-06
  Administered 2015-03-06: 1000 mL via INTRAVENOUS

## 2015-03-06 MED ORDER — ONDANSETRON HCL 4 MG/2ML IJ SOLN
4.0000 mg | Freq: Once | INTRAMUSCULAR | Status: AC | PRN
  Administered 2015-03-06: 4 mg via INTRAVENOUS
  Filled 2015-03-06: qty 2

## 2015-03-06 MED ORDER — IOHEXOL 300 MG/ML  SOLN
100.0000 mL | Freq: Once | INTRAMUSCULAR | Status: AC | PRN
  Administered 2015-03-06: 100 mL via INTRAVENOUS

## 2015-03-06 MED ORDER — KETOROLAC TROMETHAMINE 30 MG/ML IJ SOLN
30.0000 mg | Freq: Once | INTRAMUSCULAR | Status: AC
  Administered 2015-03-06: 30 mg via INTRAVENOUS
  Filled 2015-03-06: qty 1

## 2015-03-06 MED ORDER — LORAZEPAM 2 MG/ML IJ SOLN
1.0000 mg | Freq: Once | INTRAMUSCULAR | Status: AC
  Administered 2015-03-06: 1 mg via INTRAVENOUS
  Filled 2015-03-06: qty 1

## 2015-03-06 MED ORDER — ACETAMINOPHEN 325 MG PO TABS
650.0000 mg | ORAL_TABLET | Freq: Once | ORAL | Status: DC
  Filled 2015-03-06: qty 2

## 2015-03-06 MED ORDER — KETOROLAC TROMETHAMINE 60 MG/2ML IM SOLN: 60.0000 mg | Freq: Once | INTRAMUSCULAR | Status: DC

## 2015-03-06 MED ORDER — NAPROXEN 500 MG PO TABS: 500.0000 mg | ORAL_TABLET | Freq: Two times a day (BID) | ORAL | Status: DC

## 2015-03-06 MED ORDER — ONDANSETRON HCL 4 MG/2ML IJ SOLN
4.0000 mg | Freq: Once | INTRAMUSCULAR | Status: AC
  Administered 2015-03-06: 4 mg via INTRAVENOUS
  Filled 2015-03-06: qty 2

## 2015-03-06 MED ORDER — ACETAMINOPHEN 325 MG PO TABS
650.0000 mg | ORAL_TABLET | Freq: Once | ORAL | Status: AC
  Administered 2015-03-06: 650 mg via ORAL
  Filled 2015-03-06: qty 2

## 2015-03-06 MED ORDER — HYDROCODONE-ACETAMINOPHEN 5-325 MG PO TABS: 2.0000 | ORAL_TABLET | ORAL | Status: DC | PRN

## 2015-03-06 MED ORDER — DIPHENHYDRAMINE HCL 50 MG/ML IJ SOLN
50.0000 mg | Freq: Once | INTRAMUSCULAR | Status: AC
  Administered 2015-03-06: 50 mg via INTRAVENOUS
  Filled 2015-03-06: qty 1

## 2015-03-06 MED ORDER — DIPHENHYDRAMINE HCL 25 MG PO CAPS: 50.0000 mg | ORAL_CAPSULE | Freq: Once | ORAL | Status: DC

## 2015-03-06 NOTE — ED Notes (Signed)
Pt. Nausea resolved. RN has been continuously encouraging patient to drink contrast but patient refusing. PA made aware sts is able to do CT w/o contrast. CT notified.

## 2015-03-06 NOTE — ED Notes (Signed)
Patient transported to CT 

## 2015-03-06 NOTE — ED Notes (Signed)
PT unable to provide UA at this time... 

## 2015-03-06 NOTE — ED Notes (Signed)
PT still unable to provide UA sample.

## 2015-03-06 NOTE — ED Provider Notes (Signed)
Medical screening examination/treatment/procedure(s) were conducted as a shared visit with non-physician practitioner(s) and myself.  I personally evaluated the patient during the encounter.   EKG Interpretation None      Results for orders placed or performed during the hospital encounter of 03/06/15  Lipase, blood  Result Value Ref Range   Lipase 20 (L) 22 - 51 U/L  Comprehensive metabolic panel  Result Value Ref Range   Sodium 142 135 - 145 mmol/L   Potassium 3.5 3.5 - 5.1 mmol/L   Chloride 107 101 - 111 mmol/L   CO2 19 (L) 22 - 32 mmol/L   Glucose, Bld 125 (H) 65 - 99 mg/dL   BUN 13 6 - 20 mg/dL   Creatinine, Ser 0.45 0.44 - 1.00 mg/dL   Calcium 40.9 (H) 8.9 - 10.3 mg/dL   Total Protein 7.3 6.5 - 8.1 g/dL   Albumin 4.9 3.5 - 5.0 g/dL   AST 36 15 - 41 U/L   ALT 22 14 - 54 U/L   Alkaline Phosphatase 37 (L) 38 - 126 U/L   Total Bilirubin 0.7 0.3 - 1.2 mg/dL   GFR calc non Af Amer >60 >60 mL/min   GFR calc Af Amer >60 >60 mL/min   Anion gap 16 (H) 5 - 15  CBC  Result Value Ref Range   WBC 11.9 (H) 4.0 - 10.5 K/uL   RBC 4.16 3.87 - 5.11 MIL/uL   Hemoglobin 12.8 12.0 - 15.0 g/dL   HCT 81.1 91.4 - 78.2 %   MCV 93.0 78.0 - 100.0 fL   MCH 30.8 26.0 - 34.0 pg   MCHC 33.1 30.0 - 36.0 g/dL   RDW 95.6 21.3 - 08.6 %   Platelets 299 150 - 400 K/uL  Urinalysis, Routine w reflex microscopic (not at [facility])  Result Value Ref Range   Color, Urine YELLOW YELLOW   APPearance CLEAR CLEAR   Specific Gravity, Urine 1.027 1.005 - 1.030   pH 8.0 5.0 - 8.0   Glucose, UA NEGATIVE NEGATIVE mg/dL   Hgb urine dipstick LARGE (A) NEGATIVE   Bilirubin Urine NEGATIVE NEGATIVE   Ketones, ur >80 (A) NEGATIVE mg/dL   Protein, ur 30 (A) NEGATIVE mg/dL   Urobilinogen, UA 0.2 0.0 - 1.0 mg/dL   Nitrite NEGATIVE NEGATIVE   Leukocytes, UA NEGATIVE NEGATIVE  Pregnancy, urine  Result Value Ref Range   Preg Test, Ur NEGATIVE NEGATIVE  Urine microscopic-add on  Result Value Ref Range   Squamous  Epithelial / LPF FEW (A) RARE   WBC, UA 0-2 <3 WBC/hpf   RBC / HPF 21-50 <3 RBC/hpf   Urine-Other MUCOUS PRESENT    US Abdomen Complete  03/06/2015   CLINICAL DATA:  Severe gallbladder wall thickening  EXAM: ULTRASOUND ABDOMEN COMPLETE  COMPARISON:  None.  FINDINGS: Gallbladder: No cholelithiasis. Severe gallbladder wall thickening with a trace amount of pericholecystic fluid. Positive sonographic Murphy sign.  Common bile duct: Diameter: 1 mm  Liver: No focal lesion identified. Within normal limits in parenchymal echogenicity.  IVC: No abnormality visualized.  Pancreas: Visualized portion unremarkable.  Spleen: Size and appearance within normal limits.  Right Kidney: Length: 10.2 cm. Echogenicity within normal limits. No mass or hydronephrosis visualized.  Left Kidney: Length: 10.2 cm. Echogenicity within normal limits. No mass or hydronephrosis visualized.  Abdominal aorta: No aneurysm visualized.  Other findings: None.  IMPRESSION: 1. Severe gallbladder wall thickening with a trace amount of pericholecystic fluid and a positive sonographic Murphy sign. No cholelithiasis. This appearance can  be seen with acalculous cholecystitis, but can also be seen in the setting of hepatocellular disease or fluid overload.   Electronically Signed   By: Elige Ko   On: 03/06/2015 18:22   Ct Abdomen Pelvis W Contrast  03/06/2015   CLINICAL DATA:  Mid abdominal pain.  Elevated WBC.  EXAM: CT ABDOMEN AND PELVIS WITH CONTRAST  TECHNIQUE: Multidetector CT imaging of the abdomen and pelvis was performed using the standard protocol following bolus administration of intravenous contrast.  CONTRAST:  OMNIPAQUE IOHEXOL 300 MG/ML  SOLN  COMPARISON:  04/09/2013  FINDINGS: Lower chest:  Lung bases are clear.  Normal heart size.  Hepatobiliary: Normal liver. Mild periportal edema. Severe gallbladder wall thickening.  Pancreas: Normal pancreas.  Spleen: Normal spleen.  Adrenals/Urinary Tract: Normal adrenal glands. Normal  kidneys. No urolithiasis or obstructive uropathy. Normal bladder.  Stomach/Bowel: No bowel wall thickening or bowel dilatation. No pneumatosis, pneumoperitoneum or portal venous gas. Normal appendix. No abdominal or pelvic free fluid.  Vascular/Lymphatic: No abdominal or pelvic lymphadenopathy. Normal caliber abdominal aorta.  Reproductive: Normal uterus.  No adnexal mass.  Other: No fluid collection or hematoma.  Musculoskeletal: No aggressive lytic or sclerotic osseous lesion. No acute osseous abnormality.  IMPRESSION: 1. Severe gallbladder wall thickening which can be seen with cholecystitis. If there is concern regarding cholecystitis, recommend further evaluation with a right upper quadrant ultrasound.   Electronically Signed   By: Elige Ko   On: 03/06/2015 16:46    Patient seen by me.  Patient with acute onset of periumbilical abdominal pain this morning. Associated with some nausea but no real vomiting no diarrhea. Extensive workup here revealed leukocytosis and an enlarged and thickened gallbladder without evidence of sludge or stones. Common bile duct was normal. Liver function tests were normal to include alkaline phosphatase and bilirubin. Patient had similar pain in May and was admitted for 4 days at a outside hospital without any significant findings. Patient states he did an ultrasound and CAT scan then. Patient has a GI doctor in the [place] area. The believes she may have some difficulties with gastroparesis.  Today's findings could be consistent with a dysfunctional gallbladder but since things appeared to be normal gallbladder wise with similar pain back in May there may not be an association between the findings and her symptoms. Would recommend follow-up with her GI doctor and pain control as needed. Patient is stable and safe for discharge home.  Vanetta Mulders, MD 03/06/15 [id number]

## 2015-03-06 NOTE — ED Notes (Signed)
PA at bedside updating pt on plan of care 

## 2015-03-06 NOTE — ED Notes (Signed)
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

## 2015-03-06 NOTE — ED Notes (Signed)
Dr. Zackowski at bedside  

## 2015-03-06 NOTE — Discharge Instructions (Signed)

## 2015-03-06 NOTE — ED Provider Notes (Signed)
CSN: [id number]     Arrival date & time 03/06/15  1107 History   First MD Initiated Contact with Patient 03/06/15 1118     Chief Complaint  Patient presents with  . Abdominal Pain     (Consider location/radiation/quality/duration/timing/severity/associated sxs/prior Treatment) HPI Casidy Alberta is a 19 y.o. female with PMH significant for anxiety who presents with non-radiating constant, pressure-like lower abdominal pain since she woke up this morning.  It is not associated with eating.  Endorses NBNB emesis, nausea, and nonbloody diarrhea.  Denies dysuria, hematuria, CP, or SOB.  She states that this has happened in the past and they told her she had "slow digestion". No abdominal surgeries.    Past Medical History  Diagnosis Date  . Anxiety    History reviewed. No pertinent past surgical history. History reviewed. No pertinent family history. Social History  Substance Use Topics  . Smoking status: Never Smoker   . Smokeless tobacco: None  . Alcohol Use: Yes     Comment: occ   OB History    Gravida Para Term Preterm AB TAB SAB Ectopic Multiple Living       Review of Systems All other systems negative unless otherwise stated in HPI    Allergies  Review of patient's allergies indicates no known allergies.  Home Medications   Prior to Admission medications   Medication Sig Start Date End Date Taking? Authorizing Provider  mirtazapine (REMERON) 30 MG tablet Take 30 mg by mouth at bedtime.   Yes Historical Provider, MD  acetaminophen (TYLENOL) 500 MG tablet Take 500 mg by mouth every 6 (six) hours as needed for mild pain or fever.    Historical Provider, MD  albuterol (PROVENTIL HFA;VENTOLIN HFA) 108 (90 BASE) MCG/ACT inhaler Inhale 2 puffs into the lungs every 4 (four) hours as needed for wheezing or shortness of breath (cough, shortness of breath or wheezing.). 06/17/14   Deanna M Didiano, DO  ALPRAZolam (XANAX) 0.25 MG tablet Take 1 tablet (0.25 mg  total) by mouth 2 (two) times daily as needed for anxiety. Patient not taking: Reported on 03/06/2015 10/01/14   Morrell Riddle, PA-C  ondansetron (ZOFRAN) 4 MG tablet Take 1 tablet (4 mg total) by mouth every 6 (six) hours. Patient not taking: Reported on 03/06/2015 09/29/14   Oswaldo Conroy, PA-C  promethazine (PHENERGAN) 25 MG tablet Take 1 tablet (25 mg total) by mouth every 8 (eight) hours as needed for nausea or vomiting. Patient not taking: Reported on 03/06/2015 10/01/14   Morrell Riddle, PA-C   BP 116/62 mmHg  Pulse 47  Temp(Src) 97.2 F (36.2 C) (Oral)  Resp 16  Ht  (1.702 m)  Wt 115 lb (52.164 kg)  BMI 18.01 kg/m2  SpO2 99%  LMP 03/01/2015 Physical Exam  Constitutional: She is oriented to person, place, and time. She appears well-developed and well-nourished.  HENT:  Head: Normocephalic and atraumatic.  Mouth/Throat: Oropharynx is clear and moist.  Eyes: Pupils are equal, round, and reactive to light.  Neck: Normal range of motion. Neck supple.  Cardiovascular: Normal rate, regular rhythm, normal heart sounds and intact distal pulses.   No murmur heard. Pulmonary/Chest: Effort normal and breath sounds normal. Tachypnea noted. No respiratory distress. She has no wheezes. She has no rales.  Abdominal: Soft. Bowel sounds are normal. She exhibits no distension. There is generalized tenderness. There is no rigidity, no rebound and no guarding.  Musculoskeletal: Normal range of motion.  Lymphadenopathy:    She has no cervical adenopathy.  Neurological: She is alert and oriented to person, place, and time.  Skin: Skin is warm and dry.  Psychiatric: She has a normal mood and affect. Her behavior is normal.    ED Course  Procedures (including critical care time) Labs Review Labs Reviewed  LIPASE, BLOOD - Abnormal; Notable for the following:    Lipase 20 (*)    All other components within normal limits  COMPREHENSIVE METABOLIC PANEL - Abnormal; Notable for the following:     CO2 19 (*)    Glucose, Bld 125 (*)    Calcium 10.4 (*)    Alkaline Phosphatase 37 (*)    Anion gap 16 (*)    All other components within normal limits  CBC - Abnormal; Notable for the following:    WBC 11.9 (*)    All other components within normal limits  URINALYSIS, ROUTINE W REFLEX MICROSCOPIC (NOT AT [facility]) - Abnormal; Notable for the following:    Hgb urine dipstick LARGE (*)    Ketones, ur >80 (*)    Protein, ur 30 (*)    All other components within normal limits  URINE MICROSCOPIC-ADD ON - Abnormal; Notable for the following:    Squamous Epithelial / LPF FEW (*)    All other components within normal limits  PREGNANCY, URINE    Imaging Review US Abdomen Complete  03/06/2015   CLINICAL DATA:  Severe gallbladder wall thickening  EXAM: ULTRASOUND ABDOMEN COMPLETE  COMPARISON:  None.  FINDINGS: Gallbladder: No cholelithiasis. Severe gallbladder wall thickening with a trace amount of pericholecystic fluid. Positive sonographic Murphy sign.  Common bile duct: Diameter: 1 mm  Liver: No focal lesion identified. Within normal limits in parenchymal echogenicity.  IVC: No abnormality visualized.  Pancreas: Visualized portion unremarkable.  Spleen: Size and appearance within normal limits.  Right Kidney: Length: 10.2 cm. Echogenicity within normal limits. No mass or hydronephrosis visualized.  Left Kidney: Length: 10.2 cm. Echogenicity within normal limits. No mass or hydronephrosis visualized.  Abdominal aorta: No aneurysm visualized.  Other findings: None.  IMPRESSION: 1. Severe gallbladder wall thickening with a trace amount of pericholecystic fluid and a positive sonographic Murphy sign. No cholelithiasis. This appearance can be seen with acalculous cholecystitis, but can also be seen in the setting of hepatocellular disease or fluid overload.   Electronically Signed   By: Elige Ko   On: 03/06/2015 18:22   Ct Abdomen Pelvis W Contrast  03/06/2015   CLINICAL DATA:  Mid abdominal pain.   Elevated WBC.  EXAM: CT ABDOMEN AND PELVIS WITH CONTRAST  TECHNIQUE: Multidetector CT imaging of the abdomen and pelvis was performed using the standard protocol following bolus administration of intravenous contrast.  CONTRAST:  OMNIPAQUE IOHEXOL 300 MG/ML  SOLN  COMPARISON:  04/09/2013  FINDINGS: Lower chest:  Lung bases are clear.  Normal heart size.  Hepatobiliary: Normal liver. Mild periportal edema. Severe gallbladder wall thickening.  Pancreas: Normal pancreas.  Spleen: Normal spleen.  Adrenals/Urinary Tract: Normal adrenal glands. Normal kidneys. No urolithiasis or obstructive uropathy. Normal bladder.  Stomach/Bowel: No bowel wall thickening or bowel dilatation. No pneumatosis, pneumoperitoneum or portal venous gas. Normal appendix. No abdominal or pelvic free fluid.  Vascular/Lymphatic: No abdominal or pelvic lymphadenopathy. Normal caliber abdominal aorta.  Reproductive: Normal uterus.  No adnexal mass.  Other: No fluid collection or hematoma.  Musculoskeletal: No aggressive lytic or sclerotic osseous lesion. No acute osseous abnormality.  IMPRESSION: 1. Severe gallbladder wall thickening which  can be seen with cholecystitis. If there is concern regarding cholecystitis, recommend further evaluation with a right upper quadrant ultrasound.   Electronically Signed   By: Elige Ko   On: 03/06/2015 16:46   I have personally reviewed and evaluated these images and lab results as part of my medical decision-making.   EKG Interpretation None      MDM   Final diagnoses:  Abdominal pain, unspecified abdominal location  Nausea    -Patient presents with lower abdominal pain with N/V/D since this morning.  No history of abdominal surgeries. VSS, mild distress, patient appears non-toxic.  On exam, she has generalized abdominal pain.  No rebound, guarding, or rigidity.  Lipase 20.  CMP unremarkable.  CBC shows WBC 11.9.  Urine shows hematuria, but patient is "getting off of her period".  CT  abdomen pending.  -Upon reassessment, patient's mother states that patient was diagnosed with gastroparesis around Mother's Day and was started on Remeron.  Remeron has helped with the persistent nausea and abdominal pain, and that this is the first episode since starting the medication.   -CT abdomen shows severe gallbladder wall thickening.  Normal appendix.  No bowel wall thickening or dilatation.  Normal pancreas.  Will obtain abdominal ultrasound. -Abdominal ultrasound shows severe GB wall thickening.  No cholelithiasis.  Positive sonographic murphy sign.  This appearance can be seen with acalculous cholecystitis.  She will follow up with her GI doctor.  Will d/c home with norco and naprosyn for pain control.  Doubt acute cholecystitis.  Doubt appendicitis.  Doubt colitis.  Case has been discussed with and seen by Dr. Deretha Emory who agrees with the above plan for discharge.   Cheri Fowler, PA-C 03/06/15 1909

## 2015-03-09 ENCOUNTER — Encounter (HOSPITAL_COMMUNITY): Payer: Self-pay | Admitting: *Deleted

## 2015-03-09 ENCOUNTER — Emergency Department (HOSPITAL_COMMUNITY)
Admission: EM | Admit: 2015-03-09 | Discharge: 2015-03-09 | Disposition: A | Discharge status: Home / Self Care | Payer: Managed Care, Other (non HMO) | Attending: Emergency Medicine | Admitting: Emergency Medicine

## 2015-03-09 ENCOUNTER — Emergency Department (HOSPITAL_COMMUNITY): Payer: Managed Care, Other (non HMO)

## 2015-03-09 DIAGNOSIS — Z79899 Other long term (current) drug therapy: Secondary | ICD-10-CM | POA: Diagnosis not present

## 2015-03-09 DIAGNOSIS — Z3202 Encounter for pregnancy test, result negative: Secondary | ICD-10-CM | POA: Insufficient documentation

## 2015-03-09 DIAGNOSIS — F419 Anxiety disorder, unspecified: Secondary | ICD-10-CM | POA: Diagnosis not present

## 2015-03-09 DIAGNOSIS — Z8719 Personal history of other diseases of the digestive system: Secondary | ICD-10-CM | POA: Diagnosis not present

## 2015-03-09 DIAGNOSIS — N12 Tubulo-interstitial nephritis, not specified as acute or chronic: Secondary | ICD-10-CM | POA: Insufficient documentation

## 2015-03-09 DIAGNOSIS — R350 Frequency of micturition: Secondary | ICD-10-CM | POA: Diagnosis present

## 2015-03-09 HISTORY — DX: Gastroparesis: K31.84

## 2015-03-09 LAB — CBC WITH DIFFERENTIAL/PLATELET
BASOS ABS: 0 10*3/uL (ref 0.0–0.1)
BASOS PCT: 0 %
EOS ABS: 0.1 10*3/uL (ref 0.0–0.7)
EOS PCT: 1 %
HCT: 34.3 % — ABNORMAL LOW (ref 36.0–46.0)
Hemoglobin: 11.7 g/dL — ABNORMAL LOW (ref 12.0–15.0)
Lymphocytes Relative: 12 %
Lymphs Abs: 1.5 10*3/uL (ref 0.7–4.0)
MCH: 31.2 pg (ref 26.0–34.0)
MCHC: 34.1 g/dL (ref 30.0–36.0)
MCV: 91.5 fL (ref 78.0–100.0)
Monocytes Absolute: 0.9 10*3/uL (ref 0.1–1.0)
Monocytes Relative: 7 %
Neutro Abs: 10.5 10*3/uL — ABNORMAL HIGH (ref 1.7–7.7)
Neutrophils Relative %: 80 %
PLATELETS: 257 10*3/uL (ref 150–400)
RBC: 3.75 MIL/uL — AB (ref 3.87–5.11)
RDW: 12.6 % (ref 11.5–15.5)
WBC: 13 10*3/uL — AB (ref 4.0–10.5)

## 2015-03-09 LAB — COMPREHENSIVE METABOLIC PANEL
ALT: 18 U/L (ref 14–54)
AST: 25 U/L (ref 15–41)
Albumin: 4.4 g/dL (ref 3.5–5.0)
Alkaline Phosphatase: 35 U/L — ABNORMAL LOW (ref 38–126)
Anion gap: 14 (ref 5–15)
BUN: 11 mg/dL (ref 6–20)
CHLORIDE: 103 mmol/L (ref 101–111)
CO2: 19 mmol/L — AB (ref 22–32)
CREATININE: 0.89 mg/dL (ref 0.44–1.00)
Calcium: 9 mg/dL (ref 8.9–10.3)
GFR calc Af Amer: >60 mL/min (ref >60)
GFR calc non Af Amer: >60 mL/min (ref >60)
Glucose, Bld: 107 mg/dL — ABNORMAL HIGH (ref 65–99)
POTASSIUM: 3.3 mmol/L — AB (ref 3.5–5.1)
SODIUM: 136 mmol/L (ref 135–145)
Total Bilirubin: 0.6 mg/dL (ref 0.3–1.2)
Total Protein: 6.5 g/dL (ref 6.5–8.1)

## 2015-03-09 LAB — I-STAT BETA HCG BLOOD, ED (MC, WL, AP ONLY): I-stat hCG, quantitative: <5 m[IU]/mL (ref <5)

## 2015-03-09 LAB — URINALYSIS, ROUTINE W REFLEX MICROSCOPIC
BILIRUBIN URINE: NEGATIVE
Glucose, UA: NEGATIVE mg/dL
Ketones, ur: 15 mg/dL — AB
NITRITE: POSITIVE — AB
PROTEIN: NEGATIVE mg/dL
SPECIFIC GRAVITY, URINE: 1.016 (ref 1.005–1.030)
UROBILINOGEN UA: 0.2 mg/dL (ref 0.0–1.0)
pH: 7 (ref 5.0–8.0)

## 2015-03-09 LAB — URINE MICROSCOPIC-ADD ON

## 2015-03-09 LAB — LIPASE, BLOOD: Lipase: 42 U/L (ref 22–51)

## 2015-03-09 MED ORDER — HYDROMORPHONE HCL 1 MG/ML IJ SOLN
1.0000 mg | Freq: Once | INTRAMUSCULAR | Status: AC
  Administered 2015-03-09: 1 mg via INTRAVENOUS
  Filled 2015-03-09: qty 1

## 2015-03-09 MED ORDER — HYDROCODONE-ACETAMINOPHEN 5-325 MG PO TABS: 1.0000 | ORAL_TABLET | ORAL | Status: DC | PRN

## 2015-03-09 MED ORDER — LORAZEPAM 2 MG/ML IJ SOLN
0.5000 mg | Freq: Once | INTRAMUSCULAR | Status: AC
  Administered 2015-03-09: 0.5 mg via INTRAVENOUS
  Filled 2015-03-09: qty 1

## 2015-03-09 MED ORDER — SODIUM CHLORIDE 0.9 % IV BOLUS (SEPSIS)
1000.0000 mL | Freq: Once | INTRAVENOUS | Status: AC
  Administered 2015-03-09: 1000 mL via INTRAVENOUS

## 2015-03-09 MED ORDER — ONDANSETRON HCL 4 MG PO TABS: 4.0000 mg | ORAL_TABLET | Freq: Four times a day (QID) | ORAL | Status: DC

## 2015-03-09 MED ORDER — KETOROLAC TROMETHAMINE 30 MG/ML IJ SOLN
30.0000 mg | Freq: Once | INTRAMUSCULAR | Status: AC
  Administered 2015-03-09: 30 mg via INTRAVENOUS
  Filled 2015-03-09: qty 1

## 2015-03-09 MED ORDER — ONDANSETRON HCL 4 MG/2ML IJ SOLN
4.0000 mg | Freq: Once | INTRAMUSCULAR | Status: AC
  Administered 2015-03-09: 4 mg via INTRAVENOUS
  Filled 2015-03-09: qty 2

## 2015-03-09 MED ORDER — CIPROFLOXACIN HCL 500 MG PO TABS: 500.0000 mg | ORAL_TABLET | Freq: Two times a day (BID) | ORAL | Status: DC

## 2015-03-09 MED ORDER — CEFTRIAXONE SODIUM 1 G IJ SOLR
1.0000 g | Freq: Once | INTRAMUSCULAR | Status: AC
  Administered 2015-03-09: 1 g via INTRAVENOUS
  Filled 2015-03-09: qty 10

## 2015-03-09 NOTE — Discharge Instructions (Signed)

## 2015-03-09 NOTE — ED Notes (Signed)
Pt reports abdominal pain and vomiting since she woke today. Pt states that she has burning and frequency when she urinates. Pt reports hx of gastroparesis.

## 2015-03-09 NOTE — ED Provider Notes (Signed)
CSN: [id number]     Arrival date & time 03/09/15  1034 History   First MD Initiated Contact with Patient 03/09/15 1042     Chief Complaint  Patient presents with  . Abdominal Pain  . Urinary Frequency     (Consider location/radiation/quality/duration/timing/severity/associated sxs/prior Treatment) HPI   PCP: Alda Lea, MD  Chelsea Frazier is a 19 y.o.  female   Patient to the ER severe abdominal pain, nausea and vomiting. He has a past medical history of gastroparesis as diagnosed by her gastroenterologist. She was seen here for similar pain on October 2 in the emergency department and had a CT scan, ultrasound and blood work done. Findings were remarkable for thickened gallbladder but negative acute cholecystitis.  She reports taking her pain medications as prescribed but acutely at 5 am this morning she developed worsening pain. She says it is RUQ. Denies back pain, dysuria, vaginal bleeding or discharge. Unsure if she having fever. She says this pain is worse than a few days ago. Reports it is different than her typical gastroparesis which mainly causes nausea.  ROS: The patient denies diaphoresis, fever, headache, weakness (general or focal), confusion, change of vision,  dysphagia, aphagia, shortness of breath,   diarrhea, lower extremity swelling, rash, neck pain, chest pain  Past Medical History  Diagnosis Date  . Anxiety   . Gastroparesis    History reviewed. No pertinent past surgical history. No family history on file. Social History  Substance Use Topics  . Smoking status: Never Smoker   . Smokeless tobacco: None  . Alcohol Use: Yes     Comment: occ   OB History    Gravida Para Term Preterm AB TAB SAB Ectopic Multiple Living       Review of Systems  10 Systems reviewed and are negative for acute change except as noted in the HPI.    Allergies  Review of patient's allergies indicates no known allergies.  Home Medications   Prior  to Admission medications   Medication Sig Start Date End Date Taking? Authorizing Provider  acetaminophen (TYLENOL) 500 MG tablet Take 500 mg by mouth every 6 (six) hours as needed for mild pain or fever.   Yes Historical Provider, MD  albuterol (PROVENTIL HFA;VENTOLIN HFA) 108 (90 BASE) MCG/ACT inhaler Inhale 2 puffs into the lungs every 4 (four) hours as needed for wheezing or shortness of breath (cough, shortness of breath or wheezing.). 06/17/14  Yes Deanna M Didiano, DO  mirtazapine (REMERON) 30 MG tablet Take 30 mg by mouth at bedtime.   Yes Historical Provider, MD  ALPRAZolam (XANAX) 0.25 MG tablet Take 1 tablet (0.25 mg total) by mouth 2 (two) times daily as needed for anxiety. Patient not taking: Reported on 03/06/2015 10/01/14   Morrell Riddle, PA-C  ciprofloxacin (CIPRO) 500 MG tablet Take 1 tablet (500 mg total) by mouth 2 (two) times daily. 03/09/15   Marlon Pel, PA-C  HYDROcodone-acetaminophen (NORCO/VICODIN) 5-325 MG tablet Take 2 tablets by mouth every 4 (four) hours as needed. Patient not taking: Reported on 03/09/2015 03/06/15   Cheri Fowler, PA-C  naproxen (NAPROSYN) 500 MG tablet Take 1 tablet (500 mg total) by mouth 2 (two) times daily. Patient not taking: Reported on 03/09/2015 03/06/15   Cheri Fowler, PA-C  ondansetron (ZOFRAN) 4 MG tablet Take 1 tablet (4 mg total) by mouth every 6 (six) hours. Patient not taking: Reported on 03/06/2015 09/29/14   Oswaldo Conroy, PA-C  promethazine (  PHENERGAN) 25 MG tablet Take 1 tablet (25 mg total) by mouth every 8 (eight) hours as needed for nausea or vomiting. Patient not taking: Reported on 03/06/2015 10/01/14   Morrell Riddle, PA-C   BP 103/55 mmHg  Pulse 44  Temp(Src) 98.3 F (36.8 C) (Oral)  Resp 14  SpO2 99%  LMP 03/01/2015 Physical Exam  Constitutional: She appears well-developed and well-nourished. She appears distressed.  HENT:  Head: Normocephalic and atraumatic.  Eyes: Pupils are equal, round, and reactive to light.  Neck: Normal  range of motion. Neck supple.  Cardiovascular: Normal rate and regular rhythm.   Pulmonary/Chest: Effort normal.  Abdominal: Soft. Bowel sounds are normal. She exhibits no distension and no fluid wave. There is tenderness in the right upper quadrant. There is guarding. There is no rigidity, no rebound and no CVA tenderness.  Neurological: She is alert.  Skin: Skin is warm and dry. She is not diaphoretic.  Nursing note and vitals reviewed.   ED Course  Procedures (including critical care time) Labs Review Labs Reviewed  CBC WITH DIFFERENTIAL/PLATELET - Abnormal; Notable for the following:    WBC 13.0 (*)    RBC 3.75 (*)    Hemoglobin 11.7 (*)    HCT 34.3 (*)    Neutro Abs 10.5 (*)    All other components within normal limits  COMPREHENSIVE METABOLIC PANEL - Abnormal; Notable for the following:    Potassium 3.3 (*)    CO2 19 (*)    Glucose, Bld 107 (*)    Alkaline Phosphatase 35 (*)    All other components within normal limits  URINALYSIS, ROUTINE W REFLEX MICROSCOPIC (NOT AT [facility]) - Abnormal; Notable for the following:    APPearance CLOUDY (*)    Hgb urine dipstick LARGE (*)    Ketones, ur 15 (*)    Nitrite POSITIVE (*)    Leukocytes, UA MODERATE (*)    All other components within normal limits  URINE MICROSCOPIC-ADD ON - Abnormal; Notable for the following:    Bacteria, UA MANY (*)    All other components within normal limits  URINE CULTURE  LIPASE, BLOOD  I-STAT BETA HCG BLOOD, ED ([facility], [facility], [facility] ONLY)    Imaging Review US Abdomen Limited  03/09/2015   CLINICAL DATA:  Dysfunctional gallbladder, gastroparesis, abdominal pain  EXAM: US ABDOMEN LIMITED - RIGHT UPPER QUADRANT  COMPARISON:  03/06/2015  FINDINGS: Gallbladder:  No gallstones or wall thickening visualized. No sonographic Murphy sign noted.  Common bile duct:  Diameter: 2.4 mm  Liver:  No focal lesion identified. Within normal limits in parenchymal echogenicity.  IMPRESSION: Normal right upper quadrant ultrasound. The  changes to the gallbladder wall previously seen on 03/06/2015 are not appreciated on the current exam. If there is further clinical concern regarding gallbladder dysfunction, a HIDA scan with CCK is recommended.   Electronically Signed   By: Elige Ko   On: 03/09/2015 12:27   I have personally reviewed and evaluated these images and lab results as part of my medical decision-making.   EKG Interpretation None      MDM   Final diagnoses:  Pyelonephritis    Patient has pyelonephritis with a large UTI. Mildly elevated WBC at 13. Her pain and nausea have been controlled in the ED. She is afebrile with normal vitals. She is amenable to home. Will give IV rocephin in the ED and rx Cipro for home.   Medications  ketorolac (TORADOL) 30 MG/ML injection 30 mg (not administered)  sodium  chloride 0.9 % bolus 1,000 mL (0 mLs Intravenous Stopped 03/09/15 1258)  ondansetron (ZOFRAN) injection 4 mg (4 mg Intravenous Given 03/09/15 1117)  LORazepam (ATIVAN) injection 0.5 mg (0.5 mg Intravenous Given 03/09/15 1116)  HYDROmorphone (DILAUDID) injection 1 mg (1 mg Intravenous Given 03/09/15 1153)  cefTRIAXone (ROCEPHIN) 1 g in dextrose 5 % 50 mL IVPB (0 g Intravenous Stopped 03/09/15 1332)    19 y.o.Charnele Satterwhite's medical screening exam was performed and I feel the patient has had an appropriate workup for their chief complaint at this time and likelihood of emergent condition existing is low. They have been counseled on decision, discharge, follow up and which symptoms necessitate immediate return to the emergency department. They or their family verbally stated understanding and agreement with plan and discharged in stable condition.   Vital signs are stable at discharge. Filed Vitals:   03/09/15 1330  BP: 103/55  Pulse: 44  Temp:   Resp:        Marlon Pel, PA-C 03/09/15 1359  Mirian Mo, MD 03/10/15 1535

## 2015-03-09 NOTE — ED Notes (Signed)
Patient transported to Ultrasound 

## 2015-03-11 ENCOUNTER — Emergency Department (HOSPITAL_COMMUNITY)
Admission: EM | Admit: 2015-03-11 | Discharge: 2015-03-11 | Disposition: A | Discharge status: Home / Self Care | Payer: Managed Care, Other (non HMO) | Attending: Emergency Medicine | Admitting: Emergency Medicine

## 2015-03-11 ENCOUNTER — Encounter (HOSPITAL_COMMUNITY): Payer: Self-pay | Admitting: Emergency Medicine

## 2015-03-11 DIAGNOSIS — R1115 Cyclical vomiting syndrome unrelated to migraine: Secondary | ICD-10-CM

## 2015-03-11 DIAGNOSIS — K3184 Gastroparesis: Secondary | ICD-10-CM | POA: Insufficient documentation

## 2015-03-11 DIAGNOSIS — F419 Anxiety disorder, unspecified: Secondary | ICD-10-CM | POA: Diagnosis not present

## 2015-03-11 DIAGNOSIS — Z79899 Other long term (current) drug therapy: Secondary | ICD-10-CM | POA: Diagnosis not present

## 2015-03-11 DIAGNOSIS — R1010 Upper abdominal pain, unspecified: Secondary | ICD-10-CM

## 2015-03-11 DIAGNOSIS — R109 Unspecified abdominal pain: Secondary | ICD-10-CM | POA: Diagnosis present

## 2015-03-11 LAB — CBC WITH DIFFERENTIAL/PLATELET
BASOS ABS: 0 10*3/uL (ref 0.0–0.1)
Basophils Relative: 0 %
EOS ABS: 0 10*3/uL (ref 0.0–0.7)
EOS PCT: 0 %
HCT: 36.7 % (ref 36.0–46.0)
Hemoglobin: 12.4 g/dL (ref 12.0–15.0)
LYMPHS ABS: 1 10*3/uL (ref 0.7–4.0)
Lymphocytes Relative: 9 %
MCH: 30.9 pg (ref 26.0–34.0)
MCHC: 33.8 g/dL (ref 30.0–36.0)
MCV: 91.5 fL (ref 78.0–100.0)
MONO ABS: 0.5 10*3/uL (ref 0.1–1.0)
Monocytes Relative: 5 %
Neutro Abs: 8.6 10*3/uL — ABNORMAL HIGH (ref 1.7–7.7)
Neutrophils Relative %: 86 %
PLATELETS: 305 10*3/uL (ref 150–400)
RBC: 4.01 MIL/uL (ref 3.87–5.11)
RDW: 12.7 % (ref 11.5–15.5)
WBC: 10.1 10*3/uL (ref 4.0–10.5)

## 2015-03-11 LAB — CBC
HCT: 38 % (ref 36.0–46.0)
HEMOGLOBIN: 12.7 g/dL (ref 12.0–15.0)
MCH: 31.1 pg (ref 26.0–34.0)
MCHC: 33.4 g/dL (ref 30.0–36.0)
MCV: 93.1 fL (ref 78.0–100.0)
PLATELETS: 278 10*3/uL (ref 150–400)
RBC: 4.08 MIL/uL (ref 3.87–5.11)
RDW: 13 % (ref 11.5–15.5)
WBC: 12.4 10*3/uL — AB (ref 4.0–10.5)

## 2015-03-11 LAB — URINALYSIS W MICROSCOPIC (NOT AT ARMC)
BILIRUBIN URINE: NEGATIVE
Glucose, UA: NEGATIVE mg/dL
Hgb urine dipstick: NEGATIVE
KETONES UR: 40 mg/dL — AB
LEUKOCYTES UA: NEGATIVE
Nitrite: NEGATIVE
PROTEIN: NEGATIVE mg/dL
Specific Gravity, Urine: 1.012 (ref 1.005–1.030)
UROBILINOGEN UA: 0.2 mg/dL (ref 0.0–1.0)
pH: 8.5 — ABNORMAL HIGH (ref 5.0–8.0)

## 2015-03-11 LAB — I-STAT CG4 LACTIC ACID, ED
LACTIC ACID, VENOUS: 1.19 mmol/L (ref 0.5–2.0)
Lactic Acid, Venous: 2.86 mmol/L (ref 0.5–2.0)

## 2015-03-11 LAB — COMPREHENSIVE METABOLIC PANEL
ALT: 19 U/L (ref 14–54)
ANION GAP: 13 (ref 5–15)
AST: 26 U/L (ref 15–41)
Albumin: 4.2 g/dL (ref 3.5–5.0)
Alkaline Phosphatase: 33 U/L — ABNORMAL LOW (ref 38–126)
BUN: 6 mg/dL (ref 6–20)
CHLORIDE: 102 mmol/L (ref 101–111)
CO2: 26 mmol/L (ref 22–32)
CREATININE: 0.9 mg/dL (ref 0.44–1.00)
Calcium: 9.6 mg/dL (ref 8.9–10.3)
Glucose, Bld: 119 mg/dL — ABNORMAL HIGH (ref 65–99)
POTASSIUM: 3.5 mmol/L (ref 3.5–5.1)
SODIUM: 141 mmol/L (ref 135–145)
Total Bilirubin: 0.4 mg/dL (ref 0.3–1.2)
Total Protein: 6.6 g/dL (ref 6.5–8.1)

## 2015-03-11 LAB — LIPASE, BLOOD: LIPASE: 26 U/L (ref 22–51)

## 2015-03-11 MED ORDER — SODIUM CHLORIDE 0.9 % IV BOLUS (SEPSIS)
1000.0000 mL | Freq: Once | INTRAVENOUS | Status: AC
Start: 2015-03-11 — End: 2015-03-11
  Administered 2015-03-11: 1000 mL via INTRAVENOUS

## 2015-03-11 MED ORDER — ONDANSETRON HCL 4 MG/2ML IJ SOLN
4.0000 mg | Freq: Once | INTRAMUSCULAR | Status: AC
  Administered 2015-03-11: 4 mg via INTRAVENOUS
  Filled 2015-03-11: qty 2

## 2015-03-11 MED ORDER — HALOPERIDOL LACTATE 5 MG/ML IJ SOLN
2.5000 mg | Freq: Once | INTRAMUSCULAR | Status: AC
  Administered 2015-03-11: 2.5 mg via INTRAMUSCULAR
  Filled 2015-03-11: qty 1

## 2015-03-11 NOTE — ED Notes (Addendum)
Dr.liu d/c pt and went over d/c instructions with pt.

## 2015-03-11 NOTE — ED Notes (Signed)
Labs results of CG4 lactic acid of 2.86 reported to Nurse Traci.

## 2015-03-11 NOTE — ED Notes (Signed)
Pt became very anxious and requested to leave. Pt allowed to sit in a chair at the door until her 2nd L of NS is finished infusing and a repeat lactic acid is drawn.

## 2015-03-11 NOTE — ED Notes (Signed)
Pt c/o N/V abdominal pain since Sunday. Mother on phone upset that this is the pts 3rd time here in 5 days. Pt reports vomiting blood this morning around 0700, pt reports initally it was dark red and now its bright red, reports streaks. Mom is requesting that pt does not get discharged.

## 2015-03-11 NOTE — ED Provider Notes (Signed)
CSN: [id number]     Arrival date & time 03/11/15  4098 History   First MD Initiated Contact with Patient 03/11/15 0920     Chief Complaint  Patient presents with  . Emesis  . Abdominal Pain     (Consider location/radiation/quality/duration/timing/severity/associated sxs/prior Treatment) HPI 19 year old female who presents with nausea, vomiting, and abdominal pain. History of anxiety and no prior abdominal surgeries. He was recently diagnosed with gastroparesis by her gastroenterologist. She has been seen here multiple times in the emergency department for similar symptoms including once on October 2 where she had received a CT scan of right upper quadrant ultrasound and blood work. She was noted to have thickened gallbladder wall but no other evidence of acute cholecystitis. She was treated supportively and sent home. Since then has had another return to the ED for similar symptoms with resolved findings on ultrasound. She reports that she was diagnosed with kidney infection 2 days ago and placed on antibiotics. States that she has been on antibiotics for 2 days. This morning woke up at 5:30 AM with severe epigastric and right upper quadrant abdominal pain with nausea and vomiting. Says pain is much more severe than what she normally experiences with gastroparesis. Had a normal bowel movement today. Begin to notice blood streaks in her vomit on after multiple episodes. Denies hematochezia or melena. Denies any fevers, dysuria, urinary frequency, back pain, vaginal bleeding or discharge. Not having fevers but did have chills this morning.   Past Medical History  Diagnosis Date  . Anxiety   . Gastroparesis    History reviewed. No pertinent past surgical history. History reviewed. No pertinent family history. Social History  Substance Use Topics  . Smoking status: Never Smoker   . Smokeless tobacco: None  . Alcohol Use: Yes     Comment: occ   OB History    Gravida Para Term Preterm AB TAB  SAB Ectopic Multiple Living       Review of Systems 10/14 systems reviewed and are negative other than those stated in the HPI    Allergies  Review of patient's allergies indicates no known allergies.  Home Medications   Prior to Admission medications   Medication Sig Start Date End Date Taking? Authorizing Provider  acetaminophen (TYLENOL) 500 MG tablet Take 500 mg by mouth every 6 (six) hours as needed for mild pain or fever.   Yes Historical Provider, MD  albuterol (PROVENTIL HFA;VENTOLIN HFA) 108 (90 BASE) MCG/ACT inhaler Inhale 2 puffs into the lungs every 4 (four) hours as needed for wheezing or shortness of breath (cough, shortness of breath or wheezing.). 06/17/14  Yes Deanna M Didiano, DO  ciprofloxacin (CIPRO) 500 MG tablet Take 1 tablet (500 mg total) by mouth 2 (two) times daily. 03/09/15  Yes Marlon Pel, PA-C  HYDROcodone-acetaminophen (NORCO/VICODIN) 5-325 MG tablet Take 1-2 tablets by mouth every 4 (four) hours as needed. 03/09/15  Yes Tiffany Neva Seat, PA-C  mirtazapine (REMERON) 30 MG tablet Take 30 mg by mouth at bedtime.   Yes Historical Provider, MD  ondansetron (ZOFRAN) 4 MG tablet Take 1 tablet (4 mg total) by mouth every 6 (six) hours. 03/09/15  Yes Tiffany Neva Seat, PA-C  ALPRAZolam Prudy Feeler) 0.25 MG tablet Take 1 tablet (0.25 mg total) by mouth 2 (two) times daily as needed for anxiety. Patient not taking: Reported on 03/06/2015 10/01/14   Morrell Riddle, PA-C  naproxen (NAPROSYN) 500 MG tablet Take 1 tablet (500 mg  total) by mouth 2 (two) times daily. Patient not taking: Reported on 03/09/2015 03/06/15   Cheri Fowler, PA-C  promethazine (PHENERGAN) 25 MG tablet Take 1 tablet (25 mg total) by mouth every 8 (eight) hours as needed for nausea or vomiting. Patient not taking: Reported on 03/06/2015 10/01/14   Morrell Riddle, PA-C   BP 101/44 mmHg  Pulse 57  Temp(Src) 98.7 F (37.1 C) (Oral)  Resp 19  Ht  (1.702 m)  Wt 115 lb (52.164 kg)  BMI 18.01  kg/m2  SpO2 100%  LMP 03/01/2015 Physical Exam Physical Exam  Nursing note and vitals reviewed. Constitutional: Well developed, well nourished, non-toxic, and thrashing in bed screaming that she is in pain Head: Normocephalic and atraumatic.  Mouth/Throat: Oropharynx is clear. Mucous membranes appear dry.  Neck: Normal range of motion. Neck supple.  Cardiovascular: Normal rate and regular rhythm.  No edema. Pulmonary/Chest: Effort normal and breath sounds normal.  Abdominal: Soft. No distension.  No CVA tenderness. RUQ and epigastric tenderness to palpation. There is no rebound and no guarding.  Musculoskeletal: Normal range of motion.  Neurological: Alert, no facial droop, fluent speech, moves all extremities symmetrically Skin: Skin is warm and dry.  Psychiatric: Cooperative  ED Course  Procedures (including critical care time) Labs Review Labs Reviewed  CBC - Abnormal; Notable for the following:    WBC 12.4 (*)    All other components within normal limits  URINALYSIS W MICROSCOPIC - Abnormal; Notable for the following:    pH 8.5 (*)    Ketones, ur 40 (*)    All other components within normal limits  CBC WITH DIFFERENTIAL/PLATELET - Abnormal; Notable for the following:    Neutro Abs 8.6 (*)    All other components within normal limits  COMPREHENSIVE METABOLIC PANEL - Abnormal; Notable for the following:    Glucose, Bld 119 (*)    Alkaline Phosphatase 33 (*)    All other components within normal limits  I-STAT CG4 LACTIC ACID, ED - Abnormal; Notable for the following:    Lactic Acid, Venous 2.86 (*)    All other components within normal limits  URINE CULTURE  LIPASE, BLOOD  URINALYSIS, ROUTINE W REFLEX MICROSCOPIC (NOT AT [facility])  CBC WITH DIFFERENTIAL/PLATELET  HCG, SERUM, QUALITATIVE  I-STAT CG4 LACTIC ACID, ED  I-STAT CG4 LACTIC ACID, ED    Imaging Review No results found. I have personally reviewed and evaluated these images and lab results as part of my medical  decision-making.   EKG Interpretation   Date/Time:  Friday March 11 2015 09:37:50 EDT Ventricular Rate:  50 PR Interval:  96 QRS Duration: 90 QT Interval:  492 QTC Calculation: 449 R Axis:   82 Text Interpretation:  Sinus rhythm Short PR interval No significant change  since last tracing Confirmed by LIU MD, DANA [phone]) on 03/11/2015 9:43:30  AM      MDM   Final diagnoses:  Gastroparesis  Non-intractable cyclical vomiting with nausea  Pain of upper abdomen     19 year old female with history of gastroparesis and anxiety who presents with intractable nausea and vomiting and upper abdominal pain. This is similar in presentation to prior episodes of gastroparesis which she has been seen in the emergency department for. She reports that this has been confirmed by gastric emptying study by her gastroenterologist. She has also had extensive workup with CT and ultrasound imaging over the past week. She has also had upper and lower endoscopy by her GI doctor within this past  year for evaluation of this as well. Her vital signs are stable and she is nontoxic in appearance, but on presentation is found thrashing in bed screaming in severe pain. Her abdomen is overall soft, with more focal tenderness in the epigastrium and right upper quadrant. She is initially given 2.5 mg of Haldol, Zofran, and IV fluids for symptomatic control of likely gastroparesis.  Blood work shows dehydration, with ketones in her urine and mildly elevated lactic acid. UA does not show persistent infection. Remainder of blood work including CBC, comprehensive metabolic panel, lipase are all unremarkable. No  Metabolic or electrolyte derangements are noted. On reevaluation after symptomatic treatment, she reports that all her symptoms have fully resolved. On repeat exam she has a non-tender abdomen.  Lactate cleared after IV fluids and she is able to tolerate  Oral fluids and crackers without difficulty. At this time I do not  feel that repeat imaging is necessary as she just had a normal ultrasound 2 days ago and a normal CT scan this week. She will go for HIDA scan in 43 days with her gastroenterologist to evaluate for any gallbladder dysfunction. She will continue to follow-up with her gastroenterologist regarding further management of her gastroparesis. Strict return instructions also reviewed. She expected understanding of all discharge instructions and felt comfortable to plan of care.    Lavera Guise, MD 03/11/15 1256

## 2015-03-11 NOTE — Discharge Instructions (Signed)
Return without fail for worsening symptoms including vomiting unable to keep down food or fluids, fever, worsening pain, or any other symptoms concerning to you. Please go for your outpatient HIDA scan as scheduled on Monday. Please continue to follow-up with her gastroenterologist regarding further management of your symptoms.

## 2015-03-12 LAB — URINE CULTURE
Culture: >=100000
Culture: NO GROWTH
Special Requests: NORMAL

## 2015-03-13 ENCOUNTER — Telehealth: Payer: Self-pay | Admitting: *Deleted

## 2015-03-13 NOTE — ED Notes (Signed)
(+)  urine culture treated with Cirpo, OK per T. Kirichenko

## 2015-09-27 ENCOUNTER — Emergency Department (HOSPITAL_COMMUNITY): Payer: Managed Care, Other (non HMO)

## 2015-09-27 ENCOUNTER — Emergency Department (HOSPITAL_COMMUNITY)
Admission: EM | Admit: 2015-09-27 | Discharge: 2015-09-27 | Disposition: A | Discharge status: Home / Self Care | Payer: Managed Care, Other (non HMO) | Attending: Emergency Medicine | Admitting: Emergency Medicine

## 2015-09-27 ENCOUNTER — Encounter (HOSPITAL_COMMUNITY): Payer: Self-pay | Admitting: Emergency Medicine

## 2015-09-27 DIAGNOSIS — F419 Anxiety disorder, unspecified: Secondary | ICD-10-CM | POA: Insufficient documentation

## 2015-09-27 DIAGNOSIS — Z792 Long term (current) use of antibiotics: Secondary | ICD-10-CM | POA: Insufficient documentation

## 2015-09-27 DIAGNOSIS — Z3202 Encounter for pregnancy test, result negative: Secondary | ICD-10-CM | POA: Diagnosis not present

## 2015-09-27 DIAGNOSIS — R079 Chest pain, unspecified: Secondary | ICD-10-CM | POA: Insufficient documentation

## 2015-09-27 DIAGNOSIS — R05 Cough: Secondary | ICD-10-CM | POA: Diagnosis not present

## 2015-09-27 DIAGNOSIS — Z79899 Other long term (current) drug therapy: Secondary | ICD-10-CM | POA: Diagnosis not present

## 2015-09-27 DIAGNOSIS — Z9049 Acquired absence of other specified parts of digestive tract: Secondary | ICD-10-CM | POA: Insufficient documentation

## 2015-09-27 DIAGNOSIS — R1033 Periumbilical pain: Secondary | ICD-10-CM | POA: Diagnosis present

## 2015-09-27 DIAGNOSIS — K529 Noninfective gastroenteritis and colitis, unspecified: Secondary | ICD-10-CM | POA: Insufficient documentation

## 2015-09-27 DIAGNOSIS — M546 Pain in thoracic spine: Secondary | ICD-10-CM | POA: Insufficient documentation

## 2015-09-27 LAB — URINE MICROSCOPIC-ADD ON

## 2015-09-27 LAB — URINALYSIS, ROUTINE W REFLEX MICROSCOPIC
Bilirubin Urine: NEGATIVE
GLUCOSE, UA: NEGATIVE mg/dL
Hgb urine dipstick: NEGATIVE
Ketones, ur: 40 mg/dL — AB
Leukocytes, UA: NEGATIVE
NITRITE: POSITIVE — AB
PH: 8.5 — AB (ref 5.0–8.0)
Protein, ur: 30 mg/dL — AB
SPECIFIC GRAVITY, URINE: 1.021 (ref 1.005–1.030)

## 2015-09-27 LAB — COMPREHENSIVE METABOLIC PANEL
ALT: 20 U/L (ref 14–54)
ANION GAP: 15 (ref 5–15)
AST: 41 U/L (ref 15–41)
Albumin: 4.6 g/dL (ref 3.5–5.0)
Alkaline Phosphatase: 37 U/L — ABNORMAL LOW (ref 38–126)
BILIRUBIN TOTAL: 0.9 mg/dL (ref 0.3–1.2)
BUN: 7 mg/dL (ref 6–20)
CHLORIDE: 105 mmol/L (ref 101–111)
CO2: 18 mmol/L — ABNORMAL LOW (ref 22–32)
Calcium: 9.9 mg/dL (ref 8.9–10.3)
Creatinine, Ser: 0.81 mg/dL (ref 0.44–1.00)
GFR calc Af Amer: >60 mL/min (ref >60)
Glucose, Bld: 152 mg/dL — ABNORMAL HIGH (ref 65–99)
POTASSIUM: 3.6 mmol/L (ref 3.5–5.1)
Sodium: 138 mmol/L (ref 135–145)
TOTAL PROTEIN: 7.2 g/dL (ref 6.5–8.1)

## 2015-09-27 LAB — CBC
HEMATOCRIT: 37.6 % (ref 36.0–46.0)
HEMOGLOBIN: 12.7 g/dL (ref 12.0–15.0)
MCH: 30.3 pg (ref 26.0–34.0)
MCHC: 33.8 g/dL (ref 30.0–36.0)
MCV: 89.7 fL (ref 78.0–100.0)
Platelets: 279 10*3/uL (ref 150–400)
RBC: 4.19 MIL/uL (ref 3.87–5.11)
RDW: 12.7 % (ref 11.5–15.5)
WBC: 13.3 10*3/uL — AB (ref 4.0–10.5)

## 2015-09-27 LAB — PREGNANCY, URINE: Preg Test, Ur: NEGATIVE

## 2015-09-27 LAB — LIPASE, BLOOD: LIPASE: 19 U/L (ref 11–51)

## 2015-09-27 MED ORDER — ONDANSETRON 4 MG PO TBDP: ORAL_TABLET | ORAL | Status: DC

## 2015-09-27 MED ORDER — SODIUM CHLORIDE 0.9 % IV BOLUS (SEPSIS)
1000.0000 mL | Freq: Once | INTRAVENOUS | Status: AC
Start: 2015-09-27 — End: 2015-09-27
  Administered 2015-09-27: 1000 mL via INTRAVENOUS

## 2015-09-27 MED ORDER — METRONIDAZOLE 500 MG PO TABS: 500.0000 mg | ORAL_TABLET | Freq: Two times a day (BID) | ORAL | Status: DC

## 2015-09-27 MED ORDER — OXYCODONE-ACETAMINOPHEN 5-325 MG PO TABS
1.0000 | ORAL_TABLET | ORAL | Status: DC | PRN
Start: 2015-09-27 — End: 2017-05-31

## 2015-09-27 MED ORDER — IOPAMIDOL (ISOVUE-300) INJECTION 61%
INTRAVENOUS | Status: AC
  Administered 2015-09-27: 80 mL
  Filled 2015-09-27: qty 100

## 2015-09-27 MED ORDER — ONDANSETRON HCL 4 MG/2ML IJ SOLN
4.0000 mg | Freq: Once | INTRAMUSCULAR | Status: AC | PRN
  Administered 2015-09-27: 4 mg via INTRAVENOUS
  Filled 2015-09-27: qty 2

## 2015-09-27 MED ORDER — ONDANSETRON 4 MG PO TBDP
8.0000 mg | ORAL_TABLET | Freq: Once | ORAL | Status: AC
  Administered 2015-09-27: 8 mg via ORAL

## 2015-09-27 MED ORDER — ONDANSETRON 4 MG PO TBDP
ORAL_TABLET | ORAL | Status: AC
  Filled 2015-09-27: qty 2

## 2015-09-27 MED ORDER — HYDROMORPHONE HCL 1 MG/ML IJ SOLN
1.0000 mg | Freq: Once | INTRAMUSCULAR | Status: AC
  Administered 2015-09-27: 1 mg via INTRAVENOUS
  Filled 2015-09-27: qty 1

## 2015-09-27 MED ORDER — SODIUM CHLORIDE 0.9 % IV BOLUS (SEPSIS)
1000.0000 mL | Freq: Once | INTRAVENOUS | Status: AC
  Administered 2015-09-27: 1000 mL via INTRAVENOUS

## 2015-09-27 MED ORDER — ONDANSETRON HCL 4 MG/2ML IJ SOLN
4.0000 mg | Freq: Once | INTRAMUSCULAR | Status: AC
  Administered 2015-09-27: 4 mg via INTRAVENOUS
  Filled 2015-09-27: qty 2

## 2015-09-27 MED ORDER — CIPROFLOXACIN HCL 500 MG PO TABS: 500.0000 mg | ORAL_TABLET | Freq: Two times a day (BID) | ORAL | Status: DC

## 2015-09-27 NOTE — ED Notes (Signed)
EDP at bedside  

## 2015-09-27 NOTE — ED Provider Notes (Signed)
CSN: [id number]     Arrival date & time 09/27/15  1328 History   First MD Initiated Contact with Patient 09/27/15 1547     Chief Complaint  Patient presents with  . Abdominal Pain     (Consider location/radiation/quality/duration/timing/severity/associated sxs/prior Treatment) HPI  20 year old female presents with abdominal pain and vomiting that started this morning. She felt normal yesterday. Patient states this is somewhat similar to episodes she is to have last year before her gallbladder was removed. Is mostly vomiting spit at this point. No blood. Patient states the pain is mostly periumbilical. No diarrhea. Some left-sided back pain. Denies dysuria or hematuria. Has some chest pain when coughing, she is mostly coughing whenever she is having her vomiting episodes. She went to urgent care was given Tylenol and IM Phenergan with no relief. She was given ODT Zofran with no relief in the waiting room. Given IV zofran by nursing staff just prior to my evaluation.  Past Medical History  Diagnosis Date  . Anxiety   . Gastroparesis    Past Surgical History  Procedure Laterality Date  . Cholecystectomy     History reviewed. No pertinent family history. Social History  Substance Use Topics  . Smoking status: Never Smoker   . Smokeless tobacco: None  . Alcohol Use: Yes     Comment: occ   OB History    Gravida Para Term Preterm AB TAB SAB Ectopic Multiple Living   0 0 0 0 0 0 0 0 0 0      Review of Systems  Constitutional: Negative for fever.  Respiratory: Positive for cough (when vomiting). Negative for shortness of breath.   Cardiovascular: Positive for chest pain.  Gastrointestinal: Positive for nausea, vomiting and abdominal pain. Negative for diarrhea.  Genitourinary: Negative for dysuria and hematuria.  Musculoskeletal: Positive for back pain.  All other systems reviewed and are negative.     Allergies  Review of patient's allergies indicates no known  allergies.  Home Medications   Prior to Admission medications   Medication Sig Start Date End Date Taking? Authorizing Provider  acetaminophen (TYLENOL) 500 MG tablet Take 500 mg by mouth every 6 (six) hours as needed for mild pain or fever.    Historical Provider, MD  albuterol (PROVENTIL HFA;VENTOLIN HFA) 108 (90 BASE) MCG/ACT inhaler Inhale 2 puffs into the lungs every 4 (four) hours as needed for wheezing or shortness of breath (cough, shortness of breath or wheezing.). 06/17/14   Deanna M Didiano, DO  ALPRAZolam (XANAX) 0.25 MG tablet Take 1 tablet (0.25 mg total) by mouth 2 (two) times daily as needed for anxiety. Patient not taking: Reported on 03/06/2015 10/01/14   Morrell RiddleSarah L Weber, PA-C  ciprofloxacin (CIPRO) 500 MG tablet Take 1 tablet (500 mg total) by mouth 2 (two) times daily. 03/09/15   Marlon Peliffany Greene, PA-C  HYDROcodone-acetaminophen (NORCO/VICODIN) 5-325 MG tablet Take 1-2 tablets by mouth every 4 (four) hours as needed. 03/09/15   Tiffany Neva SeatGreene, PA-C  mirtazapine (REMERON) 30 MG tablet Take 30 mg by mouth at bedtime.    Historical Provider, MD  naproxen (NAPROSYN) 500 MG tablet Take 1 tablet (500 mg total) by mouth 2 (two) times daily. Patient not taking: Reported on 03/09/2015 03/06/15   Cheri FowlerKayla Rose, PA-C  ondansetron (ZOFRAN) 4 MG tablet Take 1 tablet (4 mg total) by mouth every 6 (six) hours. 03/09/15   Tiffany Neva SeatGreene, PA-C  promethazine (PHENERGAN) 25 MG tablet Take 1 tablet (25 mg total) by mouth every 8 (eight) hours as  needed for nausea or vomiting. Patient not taking: Reported on 03/06/2015 10/01/14   Morrell Riddle, PA-C   BP 127/69 mmHg  Pulse 57  Temp(Src) 97.5 F (36.4 C) (Oral)  Resp 20  SpO2 100%  LMP 09/20/2015 Physical Exam  Constitutional: She is oriented to person, place, and time. She appears well-developed and well-nourished.  HENT:  Head: Normocephalic and atraumatic.  Right Ear: External ear normal.  Left Ear: External ear normal.  Nose: Nose normal.  Eyes:  Right eye exhibits no discharge. Left eye exhibits no discharge.  Cardiovascular: Normal rate, regular rhythm and normal heart sounds.   Pulmonary/Chest: Effort normal and breath sounds normal.  Abdominal: Soft. There is tenderness in the epigastric area. There is no rigidity.  Musculoskeletal:       Thoracic back: She exhibits tenderness. She exhibits no bony tenderness.       Back:  Neurological: She is alert and oriented to person, place, and time.  Skin: Skin is warm and dry.  Nursing note and vitals reviewed.   ED Course  Procedures (including critical care time) Labs Review Labs Reviewed  COMPREHENSIVE METABOLIC PANEL - Abnormal; Notable for the following:    CO2 18 (*)    Glucose, Bld 152 (*)    Alkaline Phosphatase 37 (*)    All other components within normal limits  CBC - Abnormal; Notable for the following:    WBC 13.3 (*)    All other components within normal limits  URINALYSIS, ROUTINE W REFLEX MICROSCOPIC (NOT AT [facility]) - Abnormal; Notable for the following:    APPearance CLOUDY (*)    pH 8.5 (*)    Ketones, ur 40 (*)    Protein, ur 30 (*)    Nitrite POSITIVE (*)    All other components within normal limits  URINE MICROSCOPIC-ADD ON - Abnormal; Notable for the following:    Squamous Epithelial / LPF 0-5 (*)    Bacteria, UA MANY (*)    All other components within normal limits  URINE CULTURE  LIPASE, BLOOD  PREGNANCY, URINE  POC URINE PREG, ED    Imaging Review Ct Abdomen Pelvis W Contrast  09/27/2015  CLINICAL DATA:  Mid abdominal pain with nausea and vomiting beginning at 3 p.m. today. Initial encounter. EXAM: CT ABDOMEN AND PELVIS WITH CONTRAST TECHNIQUE: Multidetector CT imaging of the abdomen and pelvis was performed using the standard protocol following bolus administration of intravenous contrast. CONTRAST:  80 ml ISOVUE-300 IOPAMIDOL (ISOVUE-300) INJECTION 61% COMPARISON:  CT abdomen and pelvis 03/06/2015. FINDINGS: The lung bases are clear.  No pleural  or pericardial effusion. The patient has undergone cholecystectomy since the prior examination. The liver, spleen, adrenal glands, kidneys, pancreas and biliary tree are unremarkable. The walls of the ascending colon are thickened with surrounding inflammatory change. The colon is otherwise unremarkable. The stomach and small bowel appear normal. There is no evidence of appendicitis with the appendix identified posterior to the right common iliac vessels is on the prior study. Uterus, adnexa and urinary bladder appear normal. No lymphadenopathy or fluid is seen. No focal bony abnormality is identified. IMPRESSION: Findings most consistent with infectious or inflammatory colitis of the ascending colon. Negative for abscess or perforation. Electronically Signed   By: Drusilla Kanner M.D.   On: 09/27/2015 20:03   Dg Chest Portable 1 View  09/27/2015  CLINICAL DATA:  Right chest pain for 1 day EXAM: PORTABLE CHEST 1 VIEW COMPARISON:  September 29, 2014 FINDINGS: The heart size and mediastinal contours  are within normal limits. There is no focal infiltrate, pulmonary edema, or pleural effusion. There is scoliosis of spine. IMPRESSION: No active cardiopulmonary disease. Electronically Signed   By: Sherian Rein M.D.   On: 09/27/2015 16:28   I have personally reviewed and evaluated these images and lab results as part of my medical decision-making.   EKG Interpretation None      MDM   Final diagnoses:  Colitis    Patient's pain is much better after dose of IV Dilaudid. No vomiting after initial spitting up in to emesis bag on arrival. CT shows colitis in ascending colon. Mom has history of Chron's but patient states she's "tested negative" before. No diarrhea. Plan to treat with antibiotics, pain and nausea meds. Encouraged GI follow up given concern for inflammatory bowel disease. Discussed return precautions.    Pricilla Loveless, MD 09/28/15 [phone]

## 2015-09-27 NOTE — ED Notes (Signed)
Pt. C/o pain and swelling to left deltoid area where she got 25mg  of phenergan IM injection at urgent care earlier. RN noted redness and swelling to that area. EDP made aware.

## 2015-09-27 NOTE — ED Notes (Signed)
Pt arrives via PO[facility] from home with generalized abdominal pain and vomiting since this AM. Denies recent fevers.

## 2015-09-27 NOTE — ED Notes (Signed)
Pt had questionable syncopal episode while re-vitalizing.  Nurse first made aware.  Pt notably in pain.

## 2015-09-27 NOTE — ED Notes (Signed)
Pt. Transported to CT at this time.  

## 2015-09-27 NOTE — Discharge Instructions (Signed)
It is very important to see a GI specialist, either here or in your hometown. If your pain worsens or you have uncontrollable vomiting then return to the ER.

## 2015-09-28 LAB — POC URINE PREG, ED: Preg Test, Ur: NEGATIVE

## 2015-09-29 ENCOUNTER — Encounter (HOSPITAL_COMMUNITY): Payer: Self-pay | Admitting: *Deleted

## 2015-09-29 ENCOUNTER — Emergency Department (HOSPITAL_COMMUNITY)
Admission: EM | Admit: 2015-09-29 | Discharge: 2015-09-29 | Disposition: A | Discharge status: Home / Self Care | Payer: Managed Care, Other (non HMO) | Attending: Emergency Medicine | Admitting: Emergency Medicine

## 2015-09-29 ENCOUNTER — Emergency Department (HOSPITAL_COMMUNITY): Payer: Managed Care, Other (non HMO)

## 2015-09-29 DIAGNOSIS — N760 Acute vaginitis: Secondary | ICD-10-CM | POA: Diagnosis not present

## 2015-09-29 DIAGNOSIS — R102 Pelvic and perineal pain: Secondary | ICD-10-CM

## 2015-09-29 DIAGNOSIS — Z79899 Other long term (current) drug therapy: Secondary | ICD-10-CM | POA: Diagnosis not present

## 2015-09-29 DIAGNOSIS — F419 Anxiety disorder, unspecified: Secondary | ICD-10-CM | POA: Diagnosis not present

## 2015-09-29 DIAGNOSIS — R1032 Left lower quadrant pain: Secondary | ICD-10-CM | POA: Diagnosis present

## 2015-09-29 DIAGNOSIS — F1110 Opioid abuse, uncomplicated: Secondary | ICD-10-CM | POA: Insufficient documentation

## 2015-09-29 DIAGNOSIS — R112 Nausea with vomiting, unspecified: Secondary | ICD-10-CM | POA: Insufficient documentation

## 2015-09-29 DIAGNOSIS — F1210 Cannabis abuse, uncomplicated: Secondary | ICD-10-CM | POA: Insufficient documentation

## 2015-09-29 DIAGNOSIS — Z8719 Personal history of other diseases of the digestive system: Secondary | ICD-10-CM | POA: Insufficient documentation

## 2015-09-29 DIAGNOSIS — B9689 Other specified bacterial agents as the cause of diseases classified elsewhere: Secondary | ICD-10-CM

## 2015-09-29 LAB — RAPID URINE DRUG SCREEN, HOSP PERFORMED
AMPHETAMINES: NOT DETECTED
BENZODIAZEPINES: NOT DETECTED
Barbiturates: NOT DETECTED
Cocaine: NOT DETECTED
OPIATES: POSITIVE — AB
Tetrahydrocannabinol: POSITIVE — AB

## 2015-09-29 LAB — URINALYSIS, ROUTINE W REFLEX MICROSCOPIC
Bilirubin Urine: NEGATIVE
GLUCOSE, UA: NEGATIVE mg/dL
Hgb urine dipstick: NEGATIVE
Ketones, ur: 15 mg/dL — AB
LEUKOCYTES UA: NEGATIVE
NITRITE: NEGATIVE
PROTEIN: NEGATIVE mg/dL
Specific Gravity, Urine: 1.006 (ref 1.005–1.030)
pH: 7 (ref 5.0–8.0)

## 2015-09-29 LAB — COMPREHENSIVE METABOLIC PANEL
ALK PHOS: 35 U/L — AB (ref 38–126)
ALT: 27 U/L (ref 14–54)
AST: 34 U/L (ref 15–41)
Albumin: 4.3 g/dL (ref 3.5–5.0)
Anion gap: 14 (ref 5–15)
BUN: 8 mg/dL (ref 6–20)
CHLORIDE: 107 mmol/L (ref 101–111)
CO2: 18 mmol/L — ABNORMAL LOW (ref 22–32)
Calcium: 9.7 mg/dL (ref 8.9–10.3)
Creatinine, Ser: 0.84 mg/dL (ref 0.44–1.00)
Glucose, Bld: 100 mg/dL — ABNORMAL HIGH (ref 65–99)
POTASSIUM: 3.8 mmol/L (ref 3.5–5.1)
SODIUM: 139 mmol/L (ref 135–145)
TOTAL PROTEIN: 6.4 g/dL — AB (ref 6.5–8.1)
Total Bilirubin: 0.5 mg/dL (ref 0.3–1.2)

## 2015-09-29 LAB — RAPID HIV SCREEN (HIV 1/2 AB+AG)
HIV 1/2 Antibodies: NONREACTIVE
HIV-1 P24 Antigen - HIV24: NONREACTIVE

## 2015-09-29 LAB — DIFFERENTIAL
BASOS ABS: 0 10*3/uL (ref 0.0–0.1)
BASOS PCT: 0 %
Eosinophils Absolute: 0.2 10*3/uL (ref 0.0–0.7)
Eosinophils Relative: 2 %
LYMPHS PCT: 22 %
Lymphs Abs: 2.2 10*3/uL (ref 0.7–4.0)
MONO ABS: 0.6 10*3/uL (ref 0.1–1.0)
Monocytes Relative: 6 %
NEUTROS ABS: 7 10*3/uL (ref 1.7–7.7)
Neutrophils Relative %: 70 %

## 2015-09-29 LAB — CBC
HEMATOCRIT: 37.1 % (ref 36.0–46.0)
Hemoglobin: 12.6 g/dL (ref 12.0–15.0)
MCH: 31 pg (ref 26.0–34.0)
MCHC: 34 g/dL (ref 30.0–36.0)
MCV: 91.4 fL (ref 78.0–100.0)
PLATELETS: 234 10*3/uL (ref 150–400)
RBC: 4.06 MIL/uL (ref 3.87–5.11)
RDW: 12.9 % (ref 11.5–15.5)
WBC: 10 10*3/uL (ref 4.0–10.5)

## 2015-09-29 LAB — WET PREP, GENITAL
SPERM: NONE SEEN
TRICH WET PREP: NONE SEEN
Yeast Wet Prep HPF POC: NONE SEEN

## 2015-09-29 LAB — LIPASE, BLOOD: Lipase: 49 U/L (ref 11–51)

## 2015-09-29 MED ORDER — MORPHINE SULFATE (PF) 4 MG/ML IV SOLN
4.0000 mg | Freq: Once | INTRAVENOUS | Status: AC
  Administered 2015-09-29: 4 mg via INTRAVENOUS
  Filled 2015-09-29: qty 1

## 2015-09-29 MED ORDER — ONDANSETRON 4 MG PO TBDP: 4.0000 mg | ORAL_TABLET | Freq: Three times a day (TID) | ORAL | Status: DC | PRN

## 2015-09-29 MED ORDER — NAPROXEN 500 MG PO TABS: 500.0000 mg | ORAL_TABLET | Freq: Two times a day (BID) | ORAL | Status: DC

## 2015-09-29 MED ORDER — KETOROLAC TROMETHAMINE 30 MG/ML IJ SOLN
30.0000 mg | Freq: Once | INTRAMUSCULAR | Status: AC
  Administered 2015-09-29: 30 mg via INTRAVENOUS
  Filled 2015-09-29: qty 1

## 2015-09-29 MED ORDER — SODIUM CHLORIDE 0.9 % IV BOLUS (SEPSIS)
1000.0000 mL | Freq: Once | INTRAVENOUS | Status: AC
  Administered 2015-09-29: 1000 mL via INTRAVENOUS

## 2015-09-29 MED ORDER — ONDANSETRON HCL 4 MG/2ML IJ SOLN
4.0000 mg | Freq: Once | INTRAMUSCULAR | Status: AC
  Administered 2015-09-29: 4 mg via INTRAVENOUS
  Filled 2015-09-29: qty 2

## 2015-09-29 NOTE — ED Provider Notes (Signed)
CSN: [id number]     Arrival date & time 09/29/15  1028 History   First MD Initiated Contact with Patient 09/29/15 1034     Chief Complaint  Patient presents with  . Emesis     (Consider location/radiation/quality/duration/timing/severity/associated sxs/prior Treatment) HPI   Chelsea Frazier is a 20 y.o. female, with a history of anxiety, ovarian cyst, and gastroparesis, presenting to the ED with abdominal pain, nausea, and vomiting. Pt states she was seen in the ED two days ago for abdominal pain and vomiting and diagnosed with colitis. Pt prescribed Cipro and Flagyl. Pt has taken two doses total of her 7 day regimen. Pt rates her pain at 10/10, located LLQ, feels like stabbing, radiates to the RLQ. Pt endorses at least 6 episodes of vomiting in the last 12 hours. Pt also endorses increased vaginal discharge for the last three days. Pt denies diarrhea/constipation, hematochezia/melena, fever/chills, or any other complaints. LMP was April 15. Last sexual contact was 7 months ago. When asked how this pain compares to her pain from 2 days ago, patient states that the current pain is much worse, feels different, and is located on the left rather than the right.    Past Medical History  Diagnosis Date  . Anxiety   . Gastroparesis    Past Surgical History  Procedure Laterality Date  . Cholecystectomy     No family history on file. Social History  Substance Use Topics  . Smoking status: Never Smoker   . Smokeless tobacco: None  . Alcohol Use: Yes     Comment: occ   OB History    Gravida Para Term Preterm AB TAB SAB Ectopic Multiple Living       Review of Systems  Constitutional: Negative for fever, chills and diaphoresis.  Gastrointestinal: Positive for nausea, vomiting and abdominal pain. Negative for diarrhea, constipation and blood in stool.  Genitourinary: Positive for vaginal discharge. Negative for dysuria and flank pain.  Musculoskeletal: Positive for back  pain.  All other systems reviewed and are negative.     Allergies  Review of patient's allergies indicates no known allergies.  Home Medications   Prior to Admission medications   Medication Sig Start Date End Date Taking? Authorizing Provider  acetaminophen (TYLENOL) 500 MG tablet Take 500 mg by mouth every 6 (six) hours as needed for mild pain or fever.    Historical Provider, MD  albuterol (PROVENTIL HFA;VENTOLIN HFA) 108 (90 BASE) MCG/ACT inhaler Inhale 2 puffs into the lungs every 4 (four) hours as needed for wheezing or shortness of breath (cough, shortness of breath or wheezing.). 06/17/14   Deanna M Didiano, DO  ciprofloxacin (CIPRO) 500 MG tablet Take 1 tablet (500 mg total) by mouth 2 (two) times daily. One po bid x 7 days 09/27/15   Pricilla Loveless, MD  metroNIDAZOLE (FLAGYL) 500 MG tablet Take 1 tablet (500 mg total) by mouth 2 (two) times daily. One po bid x 7 days 09/27/15   Pricilla Loveless, MD  mirtazapine (REMERON) 30 MG tablet Take 30 mg by mouth at bedtime.    Historical Provider, MD  naproxen (NAPROSYN) 500 MG tablet Take 1 tablet (500 mg total) by mouth 2 (two) times daily. 09/29/15   Shawn C Joy, PA-C  ondansetron (ZOFRAN ODT) 4 MG disintegrating tablet  ODT q6 hours prn nausea/vomit 09/27/15   Pricilla Loveless, MD  ondansetron (ZOFRAN ODT) 4 MG disintegrating tablet Take 1 tablet (4 mg total) by  mouth every 8 (eight) hours as needed for nausea or vomiting. 09/29/15   Shawn C Joy, PA-C  oxyCODONE-acetaminophen (PERCOCET) 5-325 MG tablet Take 1 tablet by mouth every 4 (four) hours as needed for severe pain. 09/27/15   Pricilla Loveless, MD   BP 152/93 mmHg  Pulse 65  Temp(Src) 97.5 F (36.4 C) (Oral)  Resp 20  Ht 5\' 7"  (1.702 m)  Wt 52.164 kg  BMI 18.01 kg/m2  SpO2 99%  LMP 09/20/2015 Physical Exam  Constitutional: She appears well-developed and well-nourished. No distress.  HENT:  Head: Normocephalic and atraumatic.  Eyes: Conjunctivae are normal. Pupils are equal,  round, and reactive to light.  Neck: Neck supple.  Cardiovascular: Normal rate, regular rhythm, normal heart sounds and intact distal pulses.   Pulmonary/Chest: Effort normal and breath sounds normal. No respiratory distress.  Abdominal: Soft. There is tenderness in the suprapubic area and left lower quadrant. There is guarding and CVA tenderness (left).  Genitourinary:  External genitalia normal Vagina with discharge - moderate, white, thick discharge Cervix  normal negative for cervical motion tenderness Adnexa palpated, no masses or positive for tenderness noted on the left Bladder palpated negative for tenderness Uterus palpated no masses or negative for tenderness Otherwise normal female genitalia. Med Tech, [place], served as Biomedical engineer during exam.  Musculoskeletal: She exhibits no edema or tenderness.  Lymphadenopathy:    She has no cervical adenopathy.  Neurological: She is alert.  Skin: Skin is warm and dry. She is not diaphoretic.  Psychiatric: She has a normal mood and affect. Her behavior is normal.  Nursing note and vitals reviewed.   ED Course  Pelvic exam Date/Time: 09/29/2015 11:22 AM Performed by: Anselm Pancoast Authorized by: Harolyn Rutherford C Consent: Verbal consent obtained. Risks and benefits: risks, benefits and alternatives were discussed Consent given by: patient Patient understanding: patient states understanding of the procedure being performed Patient consent: the patient's understanding of the procedure matches consent given Procedure consent: procedure consent matches procedure scheduled Patient identity confirmed: verbally with patient and arm band Local anesthesia used: no Patient sedated: no Patient tolerance: Patient tolerated the procedure well with no immediate complications   (including critical care time) Labs Review Labs Reviewed  WET PREP, GENITAL - Abnormal; Notable for the following:    Clue Cells Wet Prep HPF POC PRESENT (*)    WBC, Wet  Prep HPF POC MODERATE (*)    All other components within normal limits  COMPREHENSIVE METABOLIC PANEL - Abnormal; Notable for the following:    CO2 18 (*)    Glucose, Bld 100 (*)    Total Protein 6.4 (*)    Alkaline Phosphatase 35 (*)    All other components within normal limits  URINALYSIS, ROUTINE W REFLEX MICROSCOPIC (NOT AT [facility]) - Abnormal; Notable for the following:    Ketones, ur 15 (*)    All other components within normal limits  URINE RAPID DRUG SCREEN, HOSP PERFORMED - Abnormal; Notable for the following:    Opiates POSITIVE (*)    Tetrahydrocannabinol POSITIVE (*)    All other components within normal limits  LIPASE, BLOOD  CBC  DIFFERENTIAL  RAPID HIV SCREEN (HIV 1/2 AB+AG)  RPR  GC/CHLAMYDIA PROBE AMP ([place]) NOT AT [facility]    Imaging Review US Pelvis Complete  09/29/2015  CLINICAL DATA:  Left lower quadrant pain with nausea and vomiting; history of previous cholecystectomy ; gastroparesis. EXAM: TRANSABDOMINAL AND TRANSVAGINAL ULTRASOUND OF PELVIS DOPPLER ULTRASOUND OF OVARIES TECHNIQUE: Both transabdominal and transvaginal  ultrasound examinations of the pelvis were performed. Transabdominal technique was performed for global imaging of the pelvis including uterus, ovaries, adnexal regions, and pelvic cul-de-sac. It was necessary to proceed with endovaginal exam following the transabdominal exam to visualize the uterus, endometrium, and ovaries. Color and duplex Doppler ultrasound was utilized to evaluate blood flow to the ovaries. COMPARISON:  Abdominal pelvic CT scan of September 27, 2015. FINDINGS: Uterus Measurements: 5.4 x 2.4 x 3.9 cm. No fibroids or other mass visualized. Endometrium Thickness: 7 mm.  No focal abnormality visualized. Right ovary Measurements: 4.1 x 1.8 x 3.7 cm. There are a few tiny cysts versus developing follicles. Left ovary Measurements: 3.2 x 1.8 x 3.2 cm. There are a few tiny cysts versus developing follicles. Pulsed Doppler evaluation of both  ovaries demonstrates normal low-resistance arterial and venous waveforms. Other findings There is small amount of free fluid in the pelvis and left adnexal region. IMPRESSION: 1. Normal appearance of the uterus and its endometrium. 2. Normal appearance of both ovaries. 3. There is a small amount of free fluid in the pelvis and left adnexal region. Electronically Signed   By: David  [place] M.D.   On: 09/29/2015 12:45   Ct Abdomen Pelvis W Contrast  09/27/2015  CLINICAL DATA:  Mid abdominal pain with nausea and vomiting beginning at 3 p.m. today. Initial encounter. EXAM: CT ABDOMEN AND PELVIS WITH CONTRAST TECHNIQUE: Multidetector CT imaging of the abdomen and pelvis was performed using the standard protocol following bolus administration of intravenous contrast. CONTRAST:  80 ml ISOVUE-300 IOPAMIDOL (ISOVUE-300) INJECTION 61% COMPARISON:  CT abdomen and pelvis 03/06/2015. FINDINGS: The lung bases are clear.  No pleural or pericardial effusion. The patient has undergone cholecystectomy since the prior examination. The liver, spleen, adrenal glands, kidneys, pancreas and biliary tree are unremarkable. The walls of the ascending colon are thickened with surrounding inflammatory change. The colon is otherwise unremarkable. The stomach and small bowel appear normal. There is no evidence of appendicitis with the appendix identified posterior to the right common iliac vessels is on the prior study. Uterus, adnexa and urinary bladder appear normal. No lymphadenopathy or fluid is seen. No focal bony abnormality is identified. IMPRESSION: Findings most consistent with infectious or inflammatory colitis of the ascending colon. Negative for abscess or perforation. Electronically Signed   By: Drusilla Kanner M.D.   On: 09/27/2015 20:03   [place] Art/ven Flow Abd Pelv Doppler  09/29/2015  CLINICAL DATA:  Left lower quadrant pain with nausea and vomiting; history of previous cholecystectomy ; gastroparesis. EXAM: TRANSABDOMINAL  AND TRANSVAGINAL ULTRASOUND OF PELVIS DOPPLER ULTRASOUND OF OVARIES TECHNIQUE: Both transabdominal and transvaginal ultrasound examinations of the pelvis were performed. Transabdominal technique was performed for global imaging of the pelvis including uterus, ovaries, adnexal regions, and pelvic cul-de-sac. It was necessary to proceed with endovaginal exam following the transabdominal exam to visualize the uterus, endometrium, and ovaries. Color and duplex Doppler ultrasound was utilized to evaluate blood flow to the ovaries. COMPARISON:  Abdominal pelvic CT scan of September 27, 2015. FINDINGS: Uterus Measurements: 5.4 x 2.4 x 3.9 cm. No fibroids or other mass visualized. Endometrium Thickness: 7 mm.  No focal abnormality visualized. Right ovary Measurements: 4.1 x 1.8 x 3.7 cm. There are a few tiny cysts versus developing follicles. Left ovary Measurements: 3.2 x 1.8 x 3.2 cm. There are a few tiny cysts versus developing follicles. Pulsed Doppler evaluation of both ovaries demonstrates normal low-resistance arterial and venous waveforms. Other findings There is small amount of free fluid  in the pelvis and left adnexal region. IMPRESSION: 1. Normal appearance of the uterus and its endometrium. 2. Normal appearance of both ovaries. 3. There is a small amount of free fluid in the pelvis and left adnexal region. Electronically Signed   By: David  [place] M.D.   On: 09/29/2015 12:45   Dg Chest Portable 1 View  09/27/2015  CLINICAL DATA:  Right chest pain for 1 day EXAM: PORTABLE CHEST 1 VIEW COMPARISON:  September 29, 2014 FINDINGS: The heart size and mediastinal contours are within normal limits. There is no focal infiltrate, pulmonary edema, or pleural effusion. There is scoliosis of spine. IMPRESSION: No active cardiopulmonary disease. Electronically Signed   By: Sherian Rein M.D.   On: 09/27/2015 16:28   I have personally reviewed and evaluated these images and lab results as part of my medical decision-making.    EKG Interpretation None       Medications  sodium chloride 0.9 % bolus 1,000 mL (0 mLs Intravenous Stopped 09/29/15 1250)  morphine 4 MG/ML injection 4 mg (4 mg Intravenous Given 09/29/15 1055)  ondansetron (ZOFRAN) injection 4 mg (4 mg Intravenous Given 09/29/15 1055)  morphine 4 MG/ML injection 4 mg (4 mg Intravenous Given 09/29/15 1249)  ketorolac (TORADOL) 30 MG/ML injection 30 mg (30 mg Intravenous Given 09/29/15 1349)   Orders Placed This Encounter  Procedures  . Pelvic exam  . Wet prep, genital  . US Pelvis Complete  . [place] Art/Ven Flow Abd Pelv Doppler  . Lipase, blood  . Comprehensive metabolic panel  . CBC  . Urinalysis, Routine w reflex microscopic (not at [facility])  . Differential  . Rapid HIV screen (HIV 1/2 Ab+Ag)  . Urine rapid drug screen (hosp performed)  . RPR  . Diet NPO time specified  . Saline Lock IV, Maintain IV access  . Pelvic cart    MDM   Final diagnoses:  Left lower quadrant pain  Non-intractable vomiting with nausea, vomiting of unspecified type  BV (bacterial vaginosis)    Chelsea Frazier presents with left lower quadrant abdominal pain accompanied by vomiting that has been worsening for the last 3 days.  Findings and plan of care discussed with Marily Memos, MD.   Patient's exam gives evidence to warrant a rule out of issues such as ovarian torsion. Patient's CT scan showed colitis, however, this was located on the right side in the ascending colon. WBC count has reduced from 13 to 10 the last 2 days. Pelvic exam with left adnexal tenderness. Evidence of BV on wet prep. Patient's current course of Flagyl should be adequate for this as well. US shows free fluid in the central and left adnexal pelvis with no evidence of torsion or ectopic pregnancy. This is likely cause of the patient's current pain. Suspect a ruptured ovarian cyst, of which patient has a known history. Patient is pain-free upon reevaluation. Patient was advised of the suspected diagnosis,  given instructions for home care, and return precautions were discussed. Patient advised to follow-up with OB/GYN via the women's clinic as soon as possible. Patient further advised to continue her previously prescribed medications, to include the antibiotics. Patient voiced understanding of these instructions, agrees to the plan, and is comfortable with discharge.   Filed Vitals:   09/29/15 1136 09/29/15 1300 09/29/15 1315 09/29/15 1330  BP:  113/61 98/52 102/50  Pulse: 65 51 56 54  Temp:      TempSrc:      Resp:      Height:  Weight:      SpO2: 99% 94% 96% 97%     Anselm PancoastShawn C Joy, PA-C 09/29/15 1357  Marily MemosJason Mesner, MD 09/29/15 [phone]

## 2015-09-29 NOTE — Discharge Instructions (Signed)
You have been seen today for abdominal pain and vomiting. Your ultrasound showed indication that you may have had a ruptured ovarian cyst.  Your lab tests showed evidence for bacterial vaginosis. Continue to take the antibiotics prescribed to you previously. These antibiotics will treat the bacterial vaginosis as well. Follow-up with OB/GYN via the women's clinic as soon as possible. Call to make an appointment or go to the walk-in clinic. Follow up with PCP as needed. Return to ED should symptoms worsen.

## 2015-09-29 NOTE — ED Notes (Signed)
Pt reports N/V and abdominal pain that has been ongoing for several days. Pt was seen here 2 days ago and given a prescription but states that it is not helping.

## 2015-09-29 NOTE — ED Notes (Signed)
Patient transported to Ultrasound 

## 2015-09-30 ENCOUNTER — Encounter (HOSPITAL_COMMUNITY): Payer: Self-pay | Admitting: Emergency Medicine

## 2015-09-30 ENCOUNTER — Emergency Department (HOSPITAL_COMMUNITY)
Admission: EM | Admit: 2015-09-30 | Discharge: 2015-09-30 | Disposition: A | Discharge status: Home / Self Care | Payer: Managed Care, Other (non HMO) | Attending: Emergency Medicine | Admitting: Emergency Medicine

## 2015-09-30 DIAGNOSIS — Z8659 Personal history of other mental and behavioral disorders: Secondary | ICD-10-CM | POA: Diagnosis not present

## 2015-09-30 DIAGNOSIS — Z8719 Personal history of other diseases of the digestive system: Secondary | ICD-10-CM | POA: Diagnosis not present

## 2015-09-30 DIAGNOSIS — Z79899 Other long term (current) drug therapy: Secondary | ICD-10-CM | POA: Diagnosis not present

## 2015-09-30 DIAGNOSIS — N898 Other specified noninflammatory disorders of vagina: Secondary | ICD-10-CM | POA: Insufficient documentation

## 2015-09-30 DIAGNOSIS — R109 Unspecified abdominal pain: Secondary | ICD-10-CM | POA: Diagnosis present

## 2015-09-30 DIAGNOSIS — R1032 Left lower quadrant pain: Secondary | ICD-10-CM | POA: Insufficient documentation

## 2015-09-30 LAB — URINE CULTURE

## 2015-09-30 LAB — RPR: RPR Ser Ql: NONREACTIVE

## 2015-09-30 LAB — GC/CHLAMYDIA PROBE AMP (~~LOC~~) NOT AT ARMC
CHLAMYDIA, DNA PROBE: NEGATIVE
Neisseria Gonorrhea: NEGATIVE

## 2015-09-30 MED ORDER — KETOROLAC TROMETHAMINE 60 MG/2ML IM SOLN
60.0000 mg | Freq: Once | INTRAMUSCULAR | Status: AC
  Administered 2015-09-30: 60 mg via INTRAMUSCULAR
  Filled 2015-09-30: qty 2

## 2015-09-30 NOTE — ED Provider Notes (Signed)
CSN: [id number]     Arrival date & time 09/30/15  1101 History   First MD Initiated Contact with Patient 09/30/15 1237     Chief Complaint  Patient presents with  . Abdominal Pain     (Consider location/radiation/quality/duration/timing/severity/associated sxs/prior Treatment) Patient is a 20 y.o. female presenting with abdominal pain. The history is provided by the patient.  Abdominal Pain Associated symptoms: vaginal discharge   Associated symptoms: no chest pain, no constipation, no cough, no diarrhea, no dysuria, no fever, no hematemesis, no hematochezia, no hematuria, no melena, no nausea, no shortness of breath, no vaginal bleeding and no vomiting    Chelsea Frazier is a 20 y.o. female with PMH significant for anxiety and gastroparesis who presents with 3 day gradual, constant, LLQ abdominal pain that she describes as a "pressure, or like someone is just pushing on you ". No modifying factors.  No aggravating factors.  She has not had any vomiting.  Denies CP, SOB, constipation, diarrhea.  She reports taking Percocet without relief.  Patient was seen 09/27/15 Or abdominal pain and N/V. Workup revealed evidence of UTI. CT showed colitis and descending colon. She was sent home with Cipro, Flagyl, Zofran, and Percocet.  Patient return to the ED 09/29/15 for LLQ abdominal pain with vomiting. Workup showed bacterial vaginosis. Pelvic ultrasound negative for torsion, it did show free fluid in the central left adnexal pelvis. This is likely due to a ruptured ovarian cyst. She was discharged home with naproxen and zofran.  Past Medical History  Diagnosis Date  . Anxiety   . Gastroparesis    Past Surgical History  Procedure Laterality Date  . Cholecystectomy     No family history on file. Social History  Substance Use Topics  . Smoking status: Never Smoker   . Smokeless tobacco: None  . Alcohol Use: Yes     Comment: occ   OB History    Gravida Para Term Preterm AB TAB SAB Ectopic  Multiple Living       Review of Systems  Constitutional: Negative for fever.  Respiratory: Negative for cough and shortness of breath.   Cardiovascular: Negative for chest pain.  Gastrointestinal: Positive for abdominal pain. Negative for nausea, vomiting, diarrhea, constipation, melena, hematochezia and hematemesis.  Genitourinary: Positive for vaginal discharge. Negative for dysuria, hematuria and vaginal bleeding.  All other systems reviewed and are negative.     Allergies  Review of patient's allergies indicates no known allergies.  Home Medications   Prior to Admission medications   Medication Sig Start Date End Date Taking? Authorizing Provider  ciprofloxacin (CIPRO) 500 MG tablet Take 1 tablet (500 mg total) by mouth 2 (two) times daily. One po bid x 7 days 09/27/15  Yes Pricilla Loveless, MD  metroNIDAZOLE (FLAGYL) 500 MG tablet Take 1 tablet (500 mg total) by mouth 2 (two) times daily. One po bid x 7 days 09/27/15  Yes Pricilla Loveless, MD  naproxen (NAPROSYN) 500 MG tablet Take 1 tablet (500 mg total) by mouth 2 (two) times daily. 09/29/15  Yes Shawn C Joy, PA-C  ondansetron (ZOFRAN ODT) 4 MG disintegrating tablet Take 1 tablet (4 mg total) by mouth every 8 (eight) hours as needed for nausea or vomiting. 09/29/15  Yes Shawn C Joy, PA-C  oxyCODONE-acetaminophen (PERCOCET) 5-325 MG tablet Take 1 tablet by mouth every 4 (four) hours as needed for severe pain. 09/27/15  Yes Pricilla Loveless, MD  albuterol (PROVENTIL HFA;VENTOLIN HFA)  108 (90 BASE) MCG/ACT inhaler Inhale 2 puffs into the lungs every 4 (four) hours as needed for wheezing or shortness of breath (cough, shortness of breath or wheezing.). Patient not taking: Reported on 09/30/2015 06/17/14   Deanna M Didiano, DO  ondansetron (ZOFRAN ODT) 4 MG disintegrating tablet  ODT q6 hours prn nausea/vomit Patient not taking: Reported on 09/30/2015 09/27/15   Pricilla Loveless, MD   BP 109/70 mmHg  Pulse 57  Temp(Src) 98.6  F (37 C) (Oral)  Resp 14  SpO2 100%  LMP 09/20/2015 Physical Exam  Constitutional: She is oriented to person, place, and time. She appears well-developed and well-nourished.  Non-toxic appearance. She does not have a sickly appearance. She does not appear ill.  HENT:  Head: Normocephalic and atraumatic.  Mouth/Throat: Oropharynx is clear and moist.  Eyes: Conjunctivae are normal.  Neck: Normal range of motion. Neck supple.  Cardiovascular: Normal rate, regular rhythm and normal heart sounds.   No murmur heard. Pulmonary/Chest: Effort normal and breath sounds normal. No accessory muscle usage or stridor. No respiratory distress. She has no wheezes. She has no rhonchi. She has no rales.  Abdominal: Soft. Bowel sounds are normal. She exhibits no distension. There is no tenderness. There is no rebound and no guarding.  Patient talking throughout palpation of abdomen without grimacing or pauses in speech.   Musculoskeletal: Normal range of motion.  Lymphadenopathy:    She has no cervical adenopathy.  Neurological: She is alert and oriented to person, place, and time.  Speech clear without dysarthria.  Skin: Skin is warm and dry.  Psychiatric: She has a normal mood and affect. Her behavior is normal.    ED Course  Procedures (including critical care time) Labs Review Labs Reviewed - No data to display  Imaging Review US Pelvis Complete  09/29/2015  CLINICAL DATA:  Left lower quadrant pain with nausea and vomiting; history of previous cholecystectomy ; gastroparesis. EXAM: TRANSABDOMINAL AND TRANSVAGINAL ULTRASOUND OF PELVIS DOPPLER ULTRASOUND OF OVARIES TECHNIQUE: Both transabdominal and transvaginal ultrasound examinations of the pelvis were performed. Transabdominal technique was performed for global imaging of the pelvis including uterus, ovaries, adnexal regions, and pelvic cul-de-sac. It was necessary to proceed with endovaginal exam following the transabdominal exam to visualize the  uterus, endometrium, and ovaries. Color and duplex Doppler ultrasound was utilized to evaluate blood flow to the ovaries. COMPARISON:  Abdominal pelvic CT scan of September 27, 2015. FINDINGS: Uterus Measurements: 5.4 x 2.4 x 3.9 cm. No fibroids or other mass visualized. Endometrium Thickness: 7 mm.  No focal abnormality visualized. Right ovary Measurements: 4.1 x 1.8 x 3.7 cm. There are a few tiny cysts versus developing follicles. Left ovary Measurements: 3.2 x 1.8 x 3.2 cm. There are a few tiny cysts versus developing follicles. Pulsed Doppler evaluation of both ovaries demonstrates normal low-resistance arterial and venous waveforms. Other findings There is small amount of free fluid in the pelvis and left adnexal region. IMPRESSION: 1. Normal appearance of the uterus and its endometrium. 2. Normal appearance of both ovaries. 3. There is a small amount of free fluid in the pelvis and left adnexal region. Electronically Signed   By: David  [place] M.D.   On: 09/29/2015 12:45   [place] Art/ven Flow Abd Pelv Doppler  09/29/2015  CLINICAL DATA:  Left lower quadrant pain with nausea and vomiting; history of previous cholecystectomy ; gastroparesis. EXAM: TRANSABDOMINAL AND TRANSVAGINAL ULTRASOUND OF PELVIS DOPPLER ULTRASOUND OF OVARIES TECHNIQUE: Both transabdominal and transvaginal ultrasound examinations of the pelvis were  performed. Transabdominal technique was performed for global imaging of the pelvis including uterus, ovaries, adnexal regions, and pelvic cul-de-sac. It was necessary to proceed with endovaginal exam following the transabdominal exam to visualize the uterus, endometrium, and ovaries. Color and duplex Doppler ultrasound was utilized to evaluate blood flow to the ovaries. COMPARISON:  Abdominal pelvic CT scan of September 27, 2015. FINDINGS: Uterus Measurements: 5.4 x 2.4 x 3.9 cm. No fibroids or other mass visualized. Endometrium Thickness: 7 mm.  No focal abnormality visualized. Right ovary Measurements:  4.1 x 1.8 x 3.7 cm. There are a few tiny cysts versus developing follicles. Left ovary Measurements: 3.2 x 1.8 x 3.2 cm. There are a few tiny cysts versus developing follicles. Pulsed Doppler evaluation of both ovaries demonstrates normal low-resistance arterial and venous waveforms. Other findings There is small amount of free fluid in the pelvis and left adnexal region. IMPRESSION: 1. Normal appearance of the uterus and its endometrium. 2. Normal appearance of both ovaries. 3. There is a small amount of free fluid in the pelvis and left adnexal region. Electronically Signed   By: David  [place] M.D.   On: 09/29/2015 12:45   I have personally reviewed and evaluated these images and lab results as part of my medical decision-making.   EKG Interpretation None      MDM   Final diagnoses:  Left lower quadrant pain    Patient presents with continued abdominal pain.  No vomiting or diarrhea since last seen.  VSS, NAD.  On exam, patient appears well, non-toxic, or septic appearing.  Heart RRR, lungs CTAB, abdomen is soft and benign.  No rebound, guarding, or rigidity.  Upon palpation of the abdomen, patient is speaking without pauses or grimaces.  Labs yesterday without acute abnormality.  No acute changes in hx or physical exam to warrant repeat labs today.  CT performed Tuesday showed colitis.  UA showed evidence of infection, urine culture shows sensitivity to Cipro.  Patient taking cipro and flagyl.  Pelvic ultrasound yesterday showed free fluid, likely from ruptured ovarian cyst.  Patient will be given Toradol for pain.  Upon recheck, patient reports improvement of pain. Discussed at length importance of follow up with GI and Gynecology.  Recommend continued cipro and flagyl.  Naproxen for pain.  I will not be prescribing narcotics, as I do not think this is indicated, and patient has previous prescription.  Discussed return precautions.  Patient and mother agree and acknowledge the above plan for  discharge.  Case has been discussed with Dr. Dalene SeltzerSchlossman who agrees with the above plan for discharge.       Cheri FowlerKayla Vishaal Strollo, PA-C 09/30/15 1437  Alvira MondayErin Schlossman, MD 10/02/15 [id number]

## 2015-09-30 NOTE — ED Notes (Signed)
Was seen yesterday for ovarian cysts and was given pain meds but it has not helped and she is back with abd pain

## 2015-09-30 NOTE — Discharge Instructions (Signed)
Your physical exam and unchanged history from yesterday's ED visit are reassuring.  There was no indication for repeat labs or imaging today.  Continue taking Cipro and Flagyl.  You may taken Naproxen twice daily for pain, and Percocet for break through pain.  Please follow up with your gastroenterologist as well as your gynecologist for further evaluation.    Abdominal Pain, Adult Many things can cause abdominal pain. Usually, abdominal pain is not caused by a disease and will improve without treatment. It can often be observed and treated at home. Your health care provider will do a physical exam and possibly order blood tests and X-rays to help determine the seriousness of your pain. However, in many cases, more time must pass before a clear cause of the pain can be found. Before that point, your health care provider may not know if you need more testing or further treatment. HOME CARE INSTRUCTIONS Monitor your abdominal pain for any changes. The following actions may help to alleviate any discomfort you are experiencing:  Only take over-the-counter or prescription medicines as directed by your health care provider.  Do not take laxatives unless directed to do so by your health care provider.  Try a clear liquid diet (broth, tea, or water) as directed by your health care provider. Slowly move to a bland diet as tolerated. SEEK MEDICAL CARE IF:  You have unexplained abdominal pain.  You have abdominal pain associated with nausea or diarrhea.  You have pain when you urinate or have a bowel movement.  You experience abdominal pain that wakes you in the night.  You have abdominal pain that is worsened or improved by eating food.  You have abdominal pain that is worsened with eating fatty foods.  You have a fever. SEEK IMMEDIATE MEDICAL CARE IF:  Your pain does not go away within 2 hours.  You keep throwing up (vomiting).  Your pain is felt only in portions of the abdomen, such as the  right side or the left lower portion of the abdomen.  You pass bloody or black tarry stools. MAKE SURE YOU:  Understand these instructions.  Will watch your condition.  Will get help right away if you are not doing well or get worse.   This information is not intended to replace advice given to you by your health care provider. Make sure you discuss any questions you have with your health care provider.   Document Released: 02/28/2005 Document Revised: 02/09/2015 Document Reviewed: 01/28/2013 Elsevier Interactive Patient Education [employer]2016 Elsevier Inc.

## 2015-10-01 ENCOUNTER — Telehealth: Payer: Self-pay

## 2015-10-01 NOTE — Telephone Encounter (Signed)
Post ED Visit - Positive Culture Follow-up  Culture report reviewed by antimicrobial stewardship pharmacist:  []  Enzo BiNathan Batchelder, Pharm.D. []  Celedonio MiyamotoJeremy Frens, Pharm.D., BCPS []  Garvin FilaMike Maccia, Pharm.D. []  Georgina PillionElizabeth Martin, Pharm.D., BCPS []  WesterveltMinh Pham, [address]Pharm[address]D., BCPS, AAHIVP []  Estella HuskMichelle Turner, Pharm.D., BCPS, AAHIVP []  Tennis Mustassie Stewart, Pharm.D. []  Sherle Poeob Vincent, [address]Pharm[address] Delle Reiningorey Ball Pharm D Positive urine culture Treated with Ciprofloxacin, organism sensitive to the same and no further patient follow-up is required at this time.  Jerry CarasCullom, Thorn Demas Burnett 10/01/2015, 9:50 AM

## 2016-07-26 ENCOUNTER — Emergency Department (HOSPITAL_COMMUNITY)
Admission: EM | Admit: 2016-07-26 | Discharge: 2016-07-26 | Disposition: A | Discharge status: Home / Self Care | Attending: Emergency Medicine | Admitting: Emergency Medicine

## 2016-07-26 ENCOUNTER — Encounter (HOSPITAL_COMMUNITY): Payer: Self-pay

## 2016-07-26 DIAGNOSIS — R197 Diarrhea, unspecified: Secondary | ICD-10-CM | POA: Diagnosis not present

## 2016-07-26 DIAGNOSIS — R112 Nausea with vomiting, unspecified: Secondary | ICD-10-CM | POA: Diagnosis not present

## 2016-07-26 DIAGNOSIS — R1013 Epigastric pain: Secondary | ICD-10-CM | POA: Diagnosis not present

## 2016-07-26 LAB — CBC
HCT: 37.6 % (ref 36.0–46.0)
Hemoglobin: 12.6 g/dL (ref 12.0–15.0)
MCH: 30.4 pg (ref 26.0–34.0)
MCHC: 33.5 g/dL (ref 30.0–36.0)
MCV: 90.8 fL (ref 78.0–100.0)
PLATELETS: 314 10*3/uL (ref 150–400)
RBC: 4.14 MIL/uL (ref 3.87–5.11)
RDW: 12.7 % (ref 11.5–15.5)
WBC: 15.9 10*3/uL — ABNORMAL HIGH (ref 4.0–10.5)

## 2016-07-26 LAB — COMPREHENSIVE METABOLIC PANEL
ALT: 24 U/L (ref 14–54)
AST: 26 U/L (ref 15–41)
Albumin: 4.7 g/dL (ref 3.5–5.0)
Alkaline Phosphatase: 38 U/L (ref 38–126)
Anion gap: 14 (ref 5–15)
BILIRUBIN TOTAL: 0.7 mg/dL (ref 0.3–1.2)
BUN: 12 mg/dL (ref 6–20)
CO2: 21 mmol/L — ABNORMAL LOW (ref 22–32)
CREATININE: 0.68 mg/dL (ref 0.44–1.00)
Calcium: 9.9 mg/dL (ref 8.9–10.3)
Chloride: 105 mmol/L (ref 101–111)
GFR calc Af Amer: >60 mL/min (ref >60)
Glucose, Bld: 144 mg/dL — ABNORMAL HIGH (ref 65–99)
Potassium: 3.7 mmol/L (ref 3.5–5.1)
Sodium: 140 mmol/L (ref 135–145)
TOTAL PROTEIN: 7.1 g/dL (ref 6.5–8.1)

## 2016-07-26 LAB — POC OCCULT BLOOD, ED: Fecal Occult Bld: NEGATIVE

## 2016-07-26 LAB — URINALYSIS, ROUTINE W REFLEX MICROSCOPIC
Bilirubin Urine: NEGATIVE
Glucose, UA: NEGATIVE mg/dL
Hgb urine dipstick: NEGATIVE
KETONES UR: 20 mg/dL — AB
Leukocytes, UA: NEGATIVE
NITRITE: NEGATIVE
Protein, ur: NEGATIVE mg/dL
Specific Gravity, Urine: 1.016 (ref 1.005–1.030)
pH: 7 (ref 5.0–8.0)

## 2016-07-26 LAB — I-STAT BETA HCG BLOOD, ED (MC, WL, AP ONLY): I-stat hCG, quantitative: <5 m[IU]/mL (ref <5)

## 2016-07-26 LAB — LIPASE, BLOOD: Lipase: 21 U/L (ref 11–51)

## 2016-07-26 LAB — ETHANOL

## 2016-07-26 LAB — CBG MONITORING, ED: Glucose-Capillary: 148 mg/dL — ABNORMAL HIGH (ref 65–99)

## 2016-07-26 MED ORDER — PROCHLORPERAZINE EDISYLATE 5 MG/ML IJ SOLN
10.0000 mg | Freq: Once | INTRAMUSCULAR | Status: AC
  Administered 2016-07-26: 10 mg via INTRAVENOUS
  Filled 2016-07-26: qty 2

## 2016-07-26 MED ORDER — ONDANSETRON HCL 4 MG/2ML IJ SOLN
4.0000 mg | Freq: Once | INTRAMUSCULAR | Status: AC
  Administered 2016-07-26: 4 mg via INTRAVENOUS
  Filled 2016-07-26: qty 2

## 2016-07-26 MED ORDER — METOCLOPRAMIDE HCL 5 MG/ML IJ SOLN
10.0000 mg | Freq: Once | INTRAMUSCULAR | Status: AC
  Administered 2016-07-26: 10 mg via INTRAVENOUS
  Filled 2016-07-26: qty 2

## 2016-07-26 MED ORDER — SODIUM CHLORIDE 0.9 % IV BOLUS (SEPSIS)
500.0000 mL | Freq: Once | INTRAVENOUS | Status: AC
  Administered 2016-07-26: 500 mL via INTRAVENOUS

## 2016-07-26 MED ORDER — PROCHLORPERAZINE MALEATE 10 MG PO TABS: 10.0000 mg | ORAL_TABLET | Freq: Two times a day (BID) | ORAL | 0 refills | Status: DC | PRN

## 2016-07-26 MED ORDER — FAMOTIDINE IN NACL 20-0.9 MG/50ML-% IV SOLN
20.0000 mg | Freq: Once | INTRAVENOUS | Status: AC
  Administered 2016-07-26: 20 mg via INTRAVENOUS
  Filled 2016-07-26: qty 50

## 2016-07-26 MED ORDER — FENTANYL CITRATE (PF) 100 MCG/2ML IJ SOLN
75.0000 ug | Freq: Once | INTRAMUSCULAR | Status: AC
  Administered 2016-07-26: 75 ug via INTRAVENOUS
  Filled 2016-07-26: qty 2

## 2016-07-26 MED ORDER — GI COCKTAIL ~~LOC~~
30.0000 mL | Freq: Once | ORAL | Status: AC
  Administered 2016-07-26: 30 mL via ORAL
  Filled 2016-07-26: qty 30

## 2016-07-26 MED ORDER — ONDANSETRON HCL 4 MG/2ML IJ SOLN: 4.0000 mg | Freq: Once | INTRAMUSCULAR | Status: DC | PRN

## 2016-07-26 MED ORDER — FENTANYL CITRATE (PF) 100 MCG/2ML IJ SOLN
50.0000 ug | Freq: Once | INTRAMUSCULAR | Status: AC
  Administered 2016-07-26: 50 ug via INTRAVENOUS
  Filled 2016-07-26: qty 2

## 2016-07-26 NOTE — ED Provider Notes (Signed)
[facility]-EMERGENCY DEPT Provider Note   CSN: [id number] Arrival date & time: 07/26/16  1209     History   Chief Complaint Chief Complaint  Patient presents with  . Abdominal Pain  . Emesis    HPI Chelsea Frazier is a 21 y.o. female.  The history is provided by the patient.  Abdominal Pain   This is a new problem. The current episode started 3 to 5 hours ago. The problem occurs constantly. The problem has not changed since onset.The pain is associated with alcohol use (last night). The pain is located in the RUQ. The quality of the pain is aching. The pain is moderate. Associated symptoms include diarrhea, nausea and vomiting (NBNB). Pertinent negatives include fever, melena, constipation and dysuria. Nothing aggravates the symptoms. Nothing relieves the symptoms. Past workup includes surgery (Lap chole in Nov).  Emesis   Associated symptoms include abdominal pain and diarrhea. Pertinent negatives include no fever.    Past Medical History:  Diagnosis Date  . Anxiety   . Gastroparesis     Patient Active Problem List   Diagnosis Date Noted  . Viral URI with cough 06/17/2014    Past Surgical History:  Procedure Laterality Date  . CHOLECYSTECTOMY      OB History    Gravida Para Term Preterm AB Living   0 0 0 0 0 0   SAB TAB Ectopic Multiple Live Births   0 0 0 0         Home Medications    Prior to Admission medications   Medication Sig Start Date End Date Taking? Authorizing Provider  Pseudoeph-CPM-DM-APAP (TYLENOL COLD) 30-2-15-325 MG TABS Take 1 tablet by mouth daily as needed (cold symptoms).   Yes Historical Provider, MD  albuterol (PROVENTIL HFA;VENTOLIN HFA) 108 (90 BASE) MCG/ACT inhaler Inhale 2 puffs into the lungs every 4 (four) hours as needed for wheezing or shortness of breath (cough, shortness of breath or wheezing.). Patient not taking: Reported on 09/30/2015 06/17/14   Deanna M Didiano, DO  ciprofloxacin (CIPRO) 500 MG tablet Take 1 tablet (500 mg total)  by mouth 2 (two) times daily. One po bid x 7 days Patient not taking: Reported on 07/26/2016 09/27/15   Pricilla Loveless, MD  metroNIDAZOLE (FLAGYL) 500 MG tablet Take 1 tablet (500 mg total) by mouth 2 (two) times daily. One po bid x 7 days Patient not taking: Reported on 07/26/2016 09/27/15   Pricilla Loveless, MD  naproxen (NAPROSYN) 500 MG tablet Take 1 tablet (500 mg total) by mouth 2 (two) times daily. Patient not taking: Reported on 07/26/2016 09/29/15   Hillard Danker Joy, PA-C  ondansetron (ZOFRAN ODT) 4 MG disintegrating tablet 4mg  ODT q6 hours prn nausea/vomit Patient not taking: Reported on 09/30/2015 09/27/15   Pricilla Loveless, MD  ondansetron (ZOFRAN ODT) 4 MG disintegrating tablet Take 1 tablet (4 mg total) by mouth every 8 (eight) hours as needed for nausea or vomiting. Patient not taking: Reported on 07/26/2016 09/29/15   Anselm Pancoast, PA-C  oxyCODONE-acetaminophen (PERCOCET) 5-325 MG tablet Take 1 tablet by mouth every 4 (four) hours as needed for severe pain. Patient not taking: Reported on 07/26/2016 09/27/15   Pricilla Loveless, MD  prochlorperazine (COMPAZINE) 10 MG tablet Take 1 tablet (10 mg total) by mouth 2 (two) times daily as needed for nausea or vomiting. 07/26/16 07/31/16  Nira Conn, MD    Family History No family history on file.  Social History Social History  Substance Use Topics  . Smoking status:  Never Smoker  . Smokeless tobacco: Never Used  . Alcohol use Yes     Comment: occ     Allergies   Patient has no known allergies.   Review of Systems Review of Systems  Constitutional: Negative for fever.  Gastrointestinal: Positive for abdominal pain, diarrhea, nausea and vomiting (NBNB). Negative for constipation and melena.  Genitourinary: Negative for dysuria.  Ten systems are reviewed and are negative for acute change except as noted in the HPI    Physical Exam Updated Vital Signs BP 99/75   Pulse 61   Temp 97.7 F (36.5 C) (Oral)   Resp 18   SpO2 100%    Physical Exam  Constitutional: She is oriented to person, place, and time. She appears well-developed and well-nourished. She appears ill. No distress.  HENT:  Head: Normocephalic and atraumatic.  Nose: Nose normal.  Mouth/Throat: Mucous membranes are dry.  Eyes: Conjunctivae and EOM are normal. Pupils are equal, round, and reactive to light. Right eye exhibits no discharge. Left eye exhibits no discharge. No scleral icterus.  Neck: Normal range of motion. Neck supple.  Cardiovascular: Normal rate and regular rhythm.  Exam reveals no gallop and no friction rub.   No murmur heard. Pulmonary/Chest: Effort normal and breath sounds normal. No stridor. No respiratory distress. She has no rales.  Abdominal: Soft. She exhibits no distension. There is tenderness (discomfort) in the epigastric area. There is no rigidity, no rebound, no guarding and no CVA tenderness.  Musculoskeletal: She exhibits no edema or tenderness.  Neurological: She is alert and oriented to person, place, and time.  Skin: Skin is warm and dry. No rash noted. She is not diaphoretic. No erythema.  Psychiatric: She has a normal mood and affect.  Vitals reviewed.    ED Treatments / Results  Labs (all labs ordered are listed, but only abnormal results are displayed) Labs Reviewed  COMPREHENSIVE METABOLIC PANEL - Abnormal; Notable for the following:       Result Value   CO2 21 (*)    Glucose, Bld 144 (*)    All other components within normal limits  CBC - Abnormal; Notable for the following:    WBC 15.9 (*)    All other components within normal limits  URINALYSIS, ROUTINE W REFLEX MICROSCOPIC - Abnormal; Notable for the following:    Ketones, ur 20 (*)    All other components within normal limits  CBG MONITORING, ED - Abnormal; Notable for the following:    Glucose-Capillary 148 (*)    All other components within normal limits  LIPASE, BLOOD  ETHANOL  I-STAT BETA HCG BLOOD, ED ([facility], [facility], [facility] ONLY)  POC OCCULT BLOOD,  ED    EKG  EKG Interpretation None       Radiology No results found.  Procedures Procedures (including critical care time)  Medications Ordered in ED Medications  metoCLOPramide (REGLAN) injection 10 mg (10 mg Intravenous Given 07/26/16 1246)  fentaNYL (SUBLIMAZE) injection 50 mcg (50 mcg Intravenous Given 07/26/16 1359)  famotidine (PEPCID) IVPB 20 mg premix (0 mg Intravenous Stopped 07/26/16 1515)  sodium chloride 0.9 % bolus 500 mL (500 mLs Intravenous New Bag/Given 07/26/16 1445)  ondansetron (ZOFRAN) injection 4 mg (4 mg Intravenous Given 07/26/16 1529)  gi cocktail (Maalox,Lidocaine,Donnatal) (30 mLs Oral Given 07/26/16 1532)  prochlorperazine (COMPAZINE) injection 10 mg (10 mg Intravenous Given 07/26/16 1526)  fentaNYL (SUBLIMAZE) injection 75 mcg (75 mcg Intravenous Given 07/26/16 1531)     Initial Impression / Assessment and Plan /  ED Course  I have reviewed the triage vital signs and the nursing notes.  Pertinent labs & imaging results that were available during my care of the patient were reviewed by me and considered in my medical decision making (see chart for details).     21 y.o. female presents with vomiting, diarrhea since this am. Unsure of possible suspicious food intake. Unsuccessful oral tolerance. Rest of history as above.  Patient appears ill, but not in distress, and with no signs of toxicity. Evidence of dehydration. Abdomen with mild discomfort, but not peritonitic Rest of the exam as above  Labs revealed leukocytosis and evidence of dehydration. No evidence of renal sufficiency. Beta hCG negative. Hemoccult negative.  Most consistent with viral gastroenteritis vs food poisoning.   Doubt appendicitis, diverticulitis, severe colitis, dysentery.    Provided with multiple antiemetics with minimal relief. Will provide additional med and reassess.  After Compazine patient was able to tolerate by mouth. On her for an additional hour without emesis.  Patient's abdominal pain has significantly improved as well.  She is appropriate for discharge with strict return precautions.  Final Clinical Impressions(s) / ED Diagnoses   Final diagnoses:  Nausea vomiting and diarrhea  Epigastric cramping   Disposition: Discharge  Condition: Good  I have discussed the results, Dx and Tx plan with the patient who expressed understanding and agree(s) with the plan. Discharge instructions discussed at great length. The patient was given strict return precautions who verbalized understanding of the instructions. No further questions at time of discharge.    New Prescriptions   PROCHLORPERAZINE (COMPAZINE) 10 MG TABLET    Take 1 tablet (10 mg total) by mouth 2 (two) times daily as needed for nausea or vomiting.    Follow Up: Alda Leahomas Lineberger, MD [address] [place] [id number] [phone]  Schedule an appointment as soon as possible for a visit  in 5-7 days, If symptoms do not improve or  worsen      Nira ConnPedro Eduardo Terius Jacuinde, MD 07/26/16 1659

## 2016-07-26 NOTE — ED Notes (Signed)
Pt assisted to bedside commode

## 2016-07-26 NOTE — ED Notes (Signed)
Attempted to get pt up to bedside commode. Pt stated she no longer needed to go to the restroom.

## 2016-07-26 NOTE — ED Triage Notes (Signed)
Patient complains of abdominal pain with vomiting and diarrhea that started this am after reported ETOH ingestion last night. On arrival vomiting and pale and diaphoretic on arrival. Alert to verbal stimuli.

## 2017-05-31 ENCOUNTER — Encounter (HOSPITAL_COMMUNITY): Payer: Self-pay | Admitting: Emergency Medicine

## 2017-05-31 ENCOUNTER — Emergency Department (HOSPITAL_COMMUNITY)
Admission: EM | Admit: 2017-05-31 | Discharge: 2017-05-31 | Disposition: A | Discharge status: Home / Self Care | Attending: Emergency Medicine | Admitting: Emergency Medicine

## 2017-05-31 ENCOUNTER — Other Ambulatory Visit: Payer: Self-pay

## 2017-05-31 DIAGNOSIS — F12188 Cannabis abuse with other cannabis-induced disorder: Secondary | ICD-10-CM | POA: Diagnosis not present

## 2017-05-31 DIAGNOSIS — Z79899 Other long term (current) drug therapy: Secondary | ICD-10-CM | POA: Diagnosis not present

## 2017-05-31 DIAGNOSIS — F12988 Cannabis use, unspecified with other cannabis-induced disorder: Secondary | ICD-10-CM

## 2017-05-31 DIAGNOSIS — R112 Nausea with vomiting, unspecified: Secondary | ICD-10-CM | POA: Insufficient documentation

## 2017-05-31 DIAGNOSIS — F1290 Cannabis use, unspecified, uncomplicated: Secondary | ICD-10-CM

## 2017-05-31 DIAGNOSIS — R1110 Vomiting, unspecified: Secondary | ICD-10-CM | POA: Diagnosis present

## 2017-05-31 LAB — LIPASE, BLOOD: Lipase: 23 U/L (ref 11–51)

## 2017-05-31 LAB — COMPREHENSIVE METABOLIC PANEL
ALBUMIN: 4.7 g/dL (ref 3.5–5.0)
ALK PHOS: 40 U/L (ref 38–126)
ALT: 17 U/L (ref 14–54)
AST: 23 U/L (ref 15–41)
Anion gap: 10 (ref 5–15)
BUN: 13 mg/dL (ref 6–20)
CALCIUM: 9.5 mg/dL (ref 8.9–10.3)
CO2: 23 mmol/L (ref 22–32)
Chloride: 104 mmol/L (ref 101–111)
Creatinine, Ser: 0.72 mg/dL (ref 0.44–1.00)
GFR calc Af Amer: >60 mL/min (ref >60)
GFR calc non Af Amer: >60 mL/min (ref >60)
GLUCOSE: 154 mg/dL — AB (ref 65–99)
Potassium: 3.7 mmol/L (ref 3.5–5.1)
Sodium: 137 mmol/L (ref 135–145)
TOTAL PROTEIN: 7.5 g/dL (ref 6.5–8.1)
Total Bilirubin: 0.5 mg/dL (ref 0.3–1.2)

## 2017-05-31 LAB — URINALYSIS, ROUTINE W REFLEX MICROSCOPIC
BILIRUBIN URINE: NEGATIVE
Glucose, UA: NEGATIVE mg/dL
HGB URINE DIPSTICK: NEGATIVE
KETONES UR: NEGATIVE mg/dL
Leukocytes, UA: NEGATIVE
NITRITE: NEGATIVE
Protein, ur: NEGATIVE mg/dL
Specific Gravity, Urine: 1.012 (ref 1.005–1.030)
pH: 7 (ref 5.0–8.0)

## 2017-05-31 LAB — CBC
HCT: 37.6 % (ref 36.0–46.0)
Hemoglobin: 12.6 g/dL (ref 12.0–15.0)
MCH: 31 pg (ref 26.0–34.0)
MCHC: 33.5 g/dL (ref 30.0–36.0)
MCV: 92.4 fL (ref 78.0–100.0)
Platelets: 297 10*3/uL (ref 150–400)
RBC: 4.07 MIL/uL (ref 3.87–5.11)
RDW: 12.4 % (ref 11.5–15.5)
WBC: 16 10*3/uL — ABNORMAL HIGH (ref 4.0–10.5)

## 2017-05-31 LAB — RAPID URINE DRUG SCREEN, HOSP PERFORMED
AMPHETAMINES: NOT DETECTED
BARBITURATES: NOT DETECTED
Benzodiazepines: NOT DETECTED
Cocaine: NOT DETECTED
Opiates: POSITIVE — AB
Tetrahydrocannabinol: POSITIVE — AB

## 2017-05-31 LAB — I-STAT BETA HCG BLOOD, ED (MC, WL, AP ONLY)

## 2017-05-31 MED ORDER — ONDANSETRON 8 MG PO TBDP: 8.0000 mg | ORAL_TABLET | Freq: Three times a day (TID) | ORAL | 0 refills | Status: DC | PRN

## 2017-05-31 MED ORDER — HYDROMORPHONE HCL 1 MG/ML IJ SOLN
0.5000 mg | Freq: Once | INTRAMUSCULAR | Status: AC
  Administered 2017-05-31: 0.5 mg via INTRAVENOUS
  Filled 2017-05-31: qty 1

## 2017-05-31 MED ORDER — ONDANSETRON 4 MG PO TBDP
4.0000 mg | ORAL_TABLET | Freq: Once | ORAL | Status: AC | PRN
  Administered 2017-05-31: 4 mg via ORAL
  Filled 2017-05-31: qty 1

## 2017-05-31 MED ORDER — LORAZEPAM 2 MG/ML IJ SOLN
1.0000 mg | Freq: Once | INTRAMUSCULAR | Status: AC
  Administered 2017-05-31: 1 mg via INTRAVENOUS
  Filled 2017-05-31: qty 1

## 2017-05-31 MED ORDER — ONDANSETRON HCL 4 MG/2ML IJ SOLN
4.0000 mg | Freq: Once | INTRAMUSCULAR | Status: AC
  Administered 2017-05-31: 4 mg via INTRAVENOUS
  Filled 2017-05-31: qty 2

## 2017-05-31 MED ORDER — FAMOTIDINE IN NACL 20-0.9 MG/50ML-% IV SOLN
20.0000 mg | Freq: Once | INTRAVENOUS | Status: AC
  Administered 2017-05-31: 20 mg via INTRAVENOUS
  Filled 2017-05-31: qty 50

## 2017-05-31 MED ORDER — INSULIN ASPART 100 UNIT/ML ~~LOC~~ SOLN: 16.0000 [IU] | Freq: Once | SUBCUTANEOUS | Status: DC

## 2017-05-31 MED ORDER — SODIUM CHLORIDE 0.9 % IV BOLUS (SEPSIS)
1000.0000 mL | Freq: Once | INTRAVENOUS | Status: AC
  Administered 2017-05-31: 1000 mL via INTRAVENOUS

## 2017-05-31 NOTE — ED Provider Notes (Signed)
[facility]-EMERGENCY DEPT Provider Note   CSN: [id number] Arrival date & time: 05/31/17  0813     History   Chief Complaint Chief Complaint  Patient presents with  . Abdominal Pain  . Emesis    HPI Chelsea Frazier is a 21 y.o. female.  Patient with hx recurrent vomiting symptoms presents c/o 1 day history of several episodes vomiting. Emesis clear, not bloody or bilious. Also with epigastric pain, constant, mod-severe, non radiating. Having normal bms. No abd distension. No fever or chills. No chest pain or sob. No known ill contacts or bad food ingestion.    The history is provided by the patient.  Abdominal Pain   Associated symptoms include vomiting. Pertinent negatives include fever and headaches.  Emesis   Associated symptoms include abdominal pain. Pertinent negatives include no chills, no cough, no fever and no headaches.    Past Medical History:  Diagnosis Date  . Anxiety   . Gastroparesis     Patient Active Problem List   Diagnosis Date Noted  . Viral URI with cough 06/17/2014    Past Surgical History:  Procedure Laterality Date  . CHOLECYSTECTOMY      OB History    Gravida Para Term Preterm AB Living   0 0 0 0 0 0   SAB TAB Ectopic Multiple Live Births   0 0 0 0         Home Medications    Prior to Admission medications   Medication Sig Start Date End Date Taking? Authorizing Provider  albuterol (PROVENTIL HFA;VENTOLIN HFA) 108 (90 BASE) MCG/ACT inhaler Inhale 2 puffs into the lungs every 4 (four) hours as needed for wheezing or shortness of breath (cough, shortness of breath or wheezing.). Patient not taking: Reported on 09/30/2015 06/17/14   Didiano, Deanna M, DO  ciprofloxacin (CIPRO) 500 MG tablet Take 1 tablet (500 mg total) by mouth 2 (two) times daily. One po bid x 7 days Patient not taking: Reported on 07/26/2016 09/27/15   Pricilla LovelessGoldston, Scott, MD  metroNIDAZOLE (FLAGYL) 500 MG tablet Take 1 tablet (500 mg total) by mouth 2  (two) times daily. One po bid x 7 days Patient not taking: Reported on 07/26/2016 09/27/15   Pricilla LovelessGoldston, Scott, MD  naproxen (NAPROSYN) 500 MG tablet Take 1 tablet (500 mg total) by mouth 2 (two) times daily. Patient not taking: Reported on 07/26/2016 09/29/15   Harolyn RutherfordJoy, Shawn C, PA-C  ondansetron (ZOFRAN ODT) 4 MG disintegrating tablet 4mg  ODT q6 hours prn nausea/vomit Patient not taking: Reported on 09/30/2015 09/27/15   Pricilla LovelessGoldston, Scott, MD  ondansetron (ZOFRAN ODT) 4 MG disintegrating tablet Take 1 tablet (4 mg total) by mouth every 8 (eight) hours as needed for nausea or vomiting. Patient not taking: Reported on 07/26/2016 09/29/15   Anselm PancoastJoy, Shawn C, PA-C  oxyCODONE-acetaminophen (PERCOCET) 5-325 MG tablet Take 1 tablet by mouth every 4 (four) hours as needed for severe pain. Patient not taking: Reported on 07/26/2016 09/27/15   Pricilla LovelessGoldston, Scott, MD  prochlorperazine (COMPAZINE) 10 MG tablet Take 1 tablet (10 mg total) by mouth 2 (two) times daily as needed for nausea or vomiting. 07/26/16 07/31/16  Nira Connardama, Pedro Eduardo, MD  Pseudoeph-CPM-DM-APAP (TYLENOL COLD) 30-2-15-325 MG TABS Take 1 tablet by mouth daily as needed (cold symptoms).    [provider]    Family History History reviewed. No pertinent family history.  Social History Social History   Tobacco Use  . Smoking status: Never Smoker  . Smokeless tobacco: Never Used  Substance Use Topics  . Alcohol use: Yes    Comment: occ  . Drug use: No     Allergies   Patient has no known allergies.   Review of Systems Review of Systems  Constitutional: Negative for chills and fever.  HENT: Negative for sore throat.   Eyes: Negative for redness.  Respiratory: Negative for cough and shortness of breath.   Cardiovascular: Negative for chest pain.  Gastrointestinal: Positive for abdominal pain and vomiting.  Genitourinary: Negative for flank pain.  Musculoskeletal: Negative for back pain and neck pain.  Skin: Negative for rash.    Neurological: Negative for headaches.  Hematological: Does not bruise/bleed easily.  Psychiatric/Behavioral: Negative for confusion.     Physical Exam Updated Vital Signs BP 123/64 (BP Location: Left Arm)   Pulse (!) 53   Temp (!) 97 F (36.1 C) (Axillary)   Resp 18   LMP 05/21/2017 (Approximate)   SpO2 100%   Physical Exam  Constitutional: She appears well-developed and well-nourished. No distress.  HENT:  Mouth/Throat: Oropharynx is clear and moist.  Eyes: Conjunctivae are normal. No scleral icterus.  Neck: Neck supple. No tracheal deviation present.  Cardiovascular: Normal rate, normal heart sounds and intact distal pulses. Exam reveals no gallop and no friction rub.  No murmur heard. Pulmonary/Chest: Effort normal and breath sounds normal. No respiratory distress.  Abdominal: Soft. Normal appearance and bowel sounds are normal. She exhibits no distension and no mass. There is tenderness. There is no rebound and no guarding. No hernia.  Epigastric tenderness.   Genitourinary:  Genitourinary Comments: No cva tenderness  Musculoskeletal: She exhibits no edema.  Neurological: She is alert.  Skin: Skin is warm and dry. No rash noted. She is not diaphoretic.  Psychiatric:  Anxious.   Nursing note and vitals reviewed.    ED Treatments / Results  Labs (all labs ordered are listed, but only abnormal results are displayed) Results for orders placed or performed during the hospital encounter of 05/31/17  Lipase, blood  Result Value Ref Range   Lipase 23 11 - 51 U/L  Comprehensive metabolic panel  Result Value Ref Range   Sodium 137 135 - 145 mmol/L   Potassium 3.7 3.5 - 5.1 mmol/L   Chloride 104 101 - 111 mmol/L   CO2 23 22 - 32 mmol/L   Glucose, Bld 154 (H) 65 - 99 mg/dL   BUN 13 6 - 20 mg/dL   Creatinine, Ser 3.660.72 0.44 - 1.00 mg/dL   Calcium 9.5 8.9 - 44.010.3 mg/dL   Total Protein 7.5 6.5 - 8.1 g/dL   Albumin 4.7 3.5 - 5.0 g/dL   AST 23 15 - 41 U/L   ALT 17 14 - 54  U/L   Alkaline Phosphatase 40 38 - 126 U/L   Total Bilirubin 0.5 0.3 - 1.2 mg/dL   GFR calc non Af Amer >60 >60 mL/min   GFR calc Af Amer >60 >60 mL/min   Anion gap 10 5 - 15  CBC  Result Value Ref Range   WBC 16.0 (H) 4.0 - 10.5 K/uL   RBC 4.07 3.87 - 5.11 MIL/uL   Hemoglobin 12.6 12.0 - 15.0 g/dL   HCT 34.737.6 42.536.0 - 95.646.0 %   MCV 92.4 78.0 - 100.0 fL   MCH 31.0 26.0 - 34.0 pg   MCHC 33.5 30.0 - 36.0 g/dL   RDW 38.712.4 56.411.5 - 33.215.5 %   Platelets 297 150 - 400 K/uL  Urinalysis, Routine w reflex microscopic  Result  Value Ref Range   Color, Urine YELLOW YELLOW   APPearance CLEAR CLEAR   Specific Gravity, Urine 1.012 1.005 - 1.030   pH 7.0 5.0 - 8.0   Glucose, UA NEGATIVE NEGATIVE mg/dL   Hgb urine dipstick NEGATIVE NEGATIVE   Bilirubin Urine NEGATIVE NEGATIVE   Ketones, ur NEGATIVE NEGATIVE mg/dL   Protein, ur NEGATIVE NEGATIVE mg/dL   Nitrite NEGATIVE NEGATIVE   Leukocytes, UA NEGATIVE NEGATIVE  Rapid urine drug screen (hospital performed)  Result Value Ref Range   Opiates POSITIVE (A) NONE DETECTED   Cocaine NONE DETECTED NONE DETECTED   Benzodiazepines NONE DETECTED NONE DETECTED   Amphetamines NONE DETECTED NONE DETECTED   Tetrahydrocannabinol POSITIVE (A) NONE DETECTED   Barbiturates NONE DETECTED NONE DETECTED  I-Stat beta hCG blood, ED  Result Value Ref Range   I-stat hCG, quantitative <5.0 <5 mIU/mL   Comment 3            EKG  EKG Interpretation None       Radiology No results found.  Procedures Procedures (including critical care time)  Medications Ordered in ED Medications  LORazepam (ATIVAN) injection 1 mg (not administered)  HYDROmorphone (DILAUDID) injection 0.5 mg (not administered)  ondansetron (ZOFRAN) injection 4 mg (not administered)  sodium chloride 0.9 % bolus 1,000 mL (not administered)  ondansetron (ZOFRAN-ODT) disintegrating tablet 4 mg (4 mg Oral Given 05/31/17 4098)     Initial Impression / Assessment and Plan / ED Course  I have  reviewed the triage vital signs and the nursing notes.  Pertinent labs & imaging results that were available during my care of the patient were reviewed by me and considered in my medical decision making (see chart for details).  Iv ns bolus. zofran iv.  Dilaudid .5 mg iv for pain. Ativan for anxiety.  Additional ns iv.  Reviewed nursing notes and prior charts for additional history.  On prior labs, uds noted +thc. ?given recurrent vomiting illness whether thc related hyperemesis syndrome.   Tolerating po. abd soft nt.  Pt currently appears stable for d/c.    Final Clinical Impressions(s) / ED Diagnoses   Final diagnoses:  None    ED Discharge Orders    None       Cathren Laine, MD 05/31/17 1341

## 2017-05-31 NOTE — Discharge Instructions (Signed)
It was our pleasure to provide your ER care today - we hope that you feel better.  Rest. Drink plenty of fluid. Take zofran as need for nausea.   Marijuana use can cause a recurrent vomiting syndrome - avoiding marijuana use likely will prevent your recurrent vomiting syndrome.   Return to ER if worse, persistent vomiting, other concern.   You were given sedating medication in the ER - no driving for the next 6 hours.

## 2017-05-31 NOTE — ED Notes (Signed)
ED Provider at bedside. 

## 2017-05-31 NOTE — ED Notes (Signed)
Patient currently in bathroom attempting to collect urine sample.

## 2017-05-31 NOTE — ED Triage Notes (Signed)
Pt with vomiting since this morning. Pt c/o sharp abdominal pain, L and R mid abdomen.

## 2017-05-31 NOTE — ED Notes (Signed)
Attempted to place patient on bedpan. Patient dry heaving (medications administered). Since arrival, patient yelling and difficult to follow intervention. Unable to urinate at this time. Will attempt at a later time post evaluation of medication effect.

## 2017-06-16 ENCOUNTER — Other Ambulatory Visit: Payer: Self-pay

## 2017-06-16 ENCOUNTER — Encounter (HOSPITAL_BASED_OUTPATIENT_CLINIC_OR_DEPARTMENT_OTHER): Payer: Self-pay | Admitting: *Deleted

## 2017-06-16 ENCOUNTER — Emergency Department (HOSPITAL_BASED_OUTPATIENT_CLINIC_OR_DEPARTMENT_OTHER)
Admission: EM | Admit: 2017-06-16 | Discharge: 2017-06-17 | Disposition: A | Discharge status: Home / Self Care | Attending: Emergency Medicine | Admitting: Emergency Medicine

## 2017-06-16 DIAGNOSIS — R112 Nausea with vomiting, unspecified: Secondary | ICD-10-CM | POA: Diagnosis not present

## 2017-06-16 DIAGNOSIS — R197 Diarrhea, unspecified: Secondary | ICD-10-CM | POA: Insufficient documentation

## 2017-06-16 DIAGNOSIS — R1033 Periumbilical pain: Secondary | ICD-10-CM | POA: Insufficient documentation

## 2017-06-16 LAB — PREGNANCY, URINE: Preg Test, Ur: NEGATIVE

## 2017-06-16 LAB — URINALYSIS, MICROSCOPIC (REFLEX): RBC / HPF: NONE SEEN RBC/hpf (ref 0–5)

## 2017-06-16 LAB — COMPREHENSIVE METABOLIC PANEL
ALBUMIN: 4.7 g/dL (ref 3.5–5.0)
ALK PHOS: 39 U/L (ref 38–126)
ALT: 20 U/L (ref 14–54)
AST: 28 U/L (ref 15–41)
Anion gap: 11 (ref 5–15)
BILIRUBIN TOTAL: 0.6 mg/dL (ref 0.3–1.2)
BUN: 13 mg/dL (ref 6–20)
CALCIUM: 9.6 mg/dL (ref 8.9–10.3)
CO2: 20 mmol/L — AB (ref 22–32)
Chloride: 106 mmol/L (ref 101–111)
Creatinine, Ser: 0.65 mg/dL (ref 0.44–1.00)
GFR calc Af Amer: >60 mL/min (ref >60)
GFR calc non Af Amer: >60 mL/min (ref >60)
GLUCOSE: 125 mg/dL — AB (ref 65–99)
Potassium: 3.8 mmol/L (ref 3.5–5.1)
SODIUM: 137 mmol/L (ref 135–145)
TOTAL PROTEIN: 7.6 g/dL (ref 6.5–8.1)

## 2017-06-16 LAB — CBC
HEMATOCRIT: 36.6 % (ref 36.0–46.0)
Hemoglobin: 12.4 g/dL (ref 12.0–15.0)
MCH: 31.2 pg (ref 26.0–34.0)
MCHC: 33.9 g/dL (ref 30.0–36.0)
MCV: 92.2 fL (ref 78.0–100.0)
Platelets: 252 10*3/uL (ref 150–400)
RBC: 3.97 MIL/uL (ref 3.87–5.11)
RDW: 11.8 % (ref 11.5–15.5)
WBC: 14.5 10*3/uL — ABNORMAL HIGH (ref 4.0–10.5)

## 2017-06-16 LAB — URINALYSIS, ROUTINE W REFLEX MICROSCOPIC
BILIRUBIN URINE: NEGATIVE
Glucose, UA: NEGATIVE mg/dL
KETONES UR: 40 mg/dL — AB
Leukocytes, UA: NEGATIVE
NITRITE: NEGATIVE
PH: 8.5 — AB (ref 5.0–8.0)
Protein, ur: NEGATIVE mg/dL
Specific Gravity, Urine: 1.02 (ref 1.005–1.030)

## 2017-06-16 LAB — LIPASE, BLOOD: Lipase: 28 U/L (ref 11–51)

## 2017-06-16 MED ORDER — HALOPERIDOL LACTATE 5 MG/ML IJ SOLN
4.0000 mg | Freq: Once | INTRAMUSCULAR | Status: AC
  Administered 2017-06-16: 4 mg via INTRAVENOUS
  Filled 2017-06-16: qty 1

## 2017-06-16 MED ORDER — ONDANSETRON 4 MG PO TBDP: 4.0000 mg | ORAL_TABLET | Freq: Three times a day (TID) | ORAL | 0 refills | Status: DC | PRN

## 2017-06-16 MED ORDER — SODIUM CHLORIDE 0.9 % IV BOLUS (SEPSIS)
1000.0000 mL | Freq: Once | INTRAVENOUS | Status: AC
  Administered 2017-06-16: 1000 mL via INTRAVENOUS

## 2017-06-16 MED ORDER — DICYCLOMINE HCL 20 MG PO TABS: 20.0000 mg | ORAL_TABLET | Freq: Two times a day (BID) | ORAL | 0 refills | Status: DC | PRN

## 2017-06-16 NOTE — ED Notes (Signed)
Pt tolerated Apple juice and crackers

## 2017-06-16 NOTE — ED Triage Notes (Signed)
Abdominal pain with nausea, denies vomiting since this morning.

## 2017-06-16 NOTE — ED Provider Notes (Signed)
[facility] EMERGENCY DEPARTMENT Provider Note   CSN: [id number] Arrival date & time: 06/16/17  1525     History   Chief Complaint Chief Complaint  Patient presents with  . Abdominal Pain    HPI Chelsea Frazier is a 22 y.o. female.  HPI   22 year old female with a history of gastroparesis presents with abdominal pain, nausea and vomiting.  Reports symptoms began this morning around 4 AM.  Reports periumbilical abdominal pain that is sharp and severe.  Reports severe vomiting.  History is limited as patient is active during history.  Denies black or bloody stools.  Reports she has had a few episodes of diarrhea.  Denies recent antibiotics, sick contacts, or suspicious foods.  Denies any recent marijuana use.  Asks whether fatty foods or alcohol could contribute.  She has had a cholecystectomy.  She has come to the emergency department other times with nausea and vomiting in the past. Reports she began to have some spotting today, was early for menses.  Denies vaginal discharge, and reports she does not believe she is pregnant.  Past Medical History:  Diagnosis Date  . Anxiety   . Gastroparesis     Patient Active Problem List   Diagnosis Date Noted  . Viral URI with cough 06/17/2014    Past Surgical History:  Procedure Laterality Date  . CHOLECYSTECTOMY      OB History    Gravida Para Term Preterm AB Living   0 0 0 0 0 0   SAB TAB Ectopic Multiple Live Births   0 0 0 0         Home Medications    Prior to Admission medications   Medication Sig Start Date End Date Taking? Authorizing Provider  albuterol (PROVENTIL HFA;VENTOLIN HFA) 108 (90 BASE) MCG/ACT inhaler Inhale 2 puffs into the lungs every 4 (four) hours as needed for wheezing or shortness of breath (cough, shortness of breath or wheezing.). Patient not taking: Reported on 09/30/2015 06/17/14   Didiano, Deanna M, DO  dicyclomine (BENTYL) 20 MG tablet Take 1 tablet (20 mg total) by mouth 2 (two) times  daily as needed for spasms (abdominal pain). 06/16/17   Alvira MondaySchlossman, Levelle Edelen, MD  ondansetron (ZOFRAN ODT) 4 MG disintegrating tablet Take 1 tablet (4 mg total) by mouth every 8 (eight) hours as needed for nausea or vomiting. 06/16/17   Alvira MondaySchlossman, Thoren Hosang, MD    Family History History reviewed. No pertinent family history.  Social History Social History   Tobacco Use  . Smoking status: Never Smoker  . Smokeless tobacco: Never Used  Substance Use Topics  . Alcohol use: Yes    Comment: occ  . Drug use: Yes    Types: Marijuana     Allergies   Patient has no known allergies.   Review of Systems Review of Systems  Constitutional: Negative for fever.  HENT: Negative for sore throat.   Eyes: Negative for visual disturbance.  Respiratory: Negative for cough and shortness of breath.   Cardiovascular: Negative for chest pain.  Gastrointestinal: Positive for abdominal pain, diarrhea, nausea and vomiting. Negative for constipation.  Genitourinary: Positive for vaginal bleeding. Negative for difficulty urinating and dysuria.  Musculoskeletal: Negative for back pain and neck pain.  Skin: Negative for rash.  Neurological: Negative for syncope and headaches.     Physical Exam Updated Vital Signs BP 105/74   Pulse 66   Temp 97.7 F (36.5 C) (Oral)   Resp 16   Ht 5\' 7"  (1.702 m)  Wt 52.2 kg (115 lb)   LMP 06/16/2017   SpO2 98%   BMI 18.01 kg/m   Physical Exam  Constitutional: She is oriented to person, place, and time. She appears well-developed and well-nourished. No distress.  Actively vomiting  HENT:  Head: Normocephalic and atraumatic.  Eyes: Conjunctivae and EOM are normal.  Neck: Normal range of motion.  Cardiovascular: Normal rate, regular rhythm, normal heart sounds and intact distal pulses. Exam reveals no gallop and no friction rub.  No murmur heard. Pulmonary/Chest: Effort normal and breath sounds normal. No respiratory distress. She has no wheezes. She has no rales.    Abdominal: Soft. She exhibits no distension. There is no tenderness. There is no guarding.  Soft abdomen, no significant tenderness  Musculoskeletal: She exhibits no edema or tenderness.  Neurological: She is alert and oriented to person, place, and time.  Skin: Skin is warm and dry. No rash noted. She is not diaphoretic. No erythema.  Nursing note and vitals reviewed.    ED Treatments / Results  Labs (all labs ordered are listed, but only abnormal results are displayed) Labs Reviewed  COMPREHENSIVE METABOLIC PANEL - Abnormal; Notable for the following components:      Result Value   CO2 20 (*)    Glucose, Bld 125 (*)    All other components within normal limits  CBC - Abnormal; Notable for the following components:   WBC 14.5 (*)    All other components within normal limits  URINALYSIS, ROUTINE W REFLEX MICROSCOPIC - Abnormal; Notable for the following components:   pH 8.5 (*)    Hgb urine dipstick TRACE (*)    Ketones, ur 40 (*)    All other components within normal limits  URINALYSIS, MICROSCOPIC (REFLEX) - Abnormal; Notable for the following components:   Bacteria, UA RARE (*)    Squamous Epithelial / LPF 0-5 (*)    All other components within normal limits  LIPASE, BLOOD  PREGNANCY, URINE    EKG  EKG Interpretation None       Radiology No results found.  Procedures Procedures (including critical care time)  Medications Ordered in ED Medications  sodium chloride 0.9 % bolus 1,000 mL (0 mLs Intravenous Stopped 06/16/17 1659)  haloperidol lactate (HALDOL) injection 4 mg (4 mg Intravenous Given 06/16/17 1619)     Initial Impression / Assessment and Plan / ED Course  I have reviewed the triage vital signs and the nursing notes.  Pertinent labs & imaging results that were available during my care of the patient were reviewed by me and considered in my medical decision making (see chart for details).     22 year old female with a history of gastroparesis  presents with abdominal pain, nausea and vomiting.  Abdominal exam benign, nondistended, have low suspicion for diverticulitis, small bowel obstruction, or other acute intra-abdominal pathology.    Suspect possible gastroenteritis given nausea, vomiting and diarrhea, versus possible cannabinoid hyperemesis given history of same, or other gastroparesis.  Patient is given IV fluids, and Haldol for symptoms with improvement.   Labs show no significant findings.  Able to tolerate po fluids without emesis.    Given rx for zofran, bentyl. Patient discharged in stable condition with understanding of reasons to return.   Final Clinical Impressions(s) / ED Diagnoses   Final diagnoses:  Nausea vomiting and diarrhea  Periumbilical abdominal pain    ED Discharge Orders        Ordered    ondansetron (ZOFRAN ODT) 4 MG disintegrating tablet  Every 8 hours PRN     06/16/17 1941    dicyclomine (BENTYL) 20 MG tablet  2 times daily PRN     06/16/17 1941       Alvira Monday, MD 06/17/17 [phone]

## 2017-06-16 NOTE — ED Notes (Signed)
Alert, NAD, calm, interactive, resps e/u, speaking in clear complete sentences, no dyspnea noted, skin W&D, VSS, "feel better, ready to go", (denies: pain, sob, nausea, dizziness or visual changes). [facility]

## 2018-01-03 ENCOUNTER — Other Ambulatory Visit: Payer: Self-pay

## 2018-01-03 ENCOUNTER — Encounter (HOSPITAL_COMMUNITY): Payer: Self-pay

## 2018-01-03 ENCOUNTER — Ambulatory Visit (HOSPITAL_COMMUNITY)
Admission: EM | Admit: 2018-01-03 | Discharge: 2018-01-03 | Disposition: A | Discharge status: Home / Self Care | Attending: Internal Medicine | Admitting: Internal Medicine

## 2018-01-03 DIAGNOSIS — R110 Nausea: Secondary | ICD-10-CM | POA: Diagnosis not present

## 2018-01-03 DIAGNOSIS — R51 Headache: Secondary | ICD-10-CM

## 2018-01-03 DIAGNOSIS — R519 Headache, unspecified: Secondary | ICD-10-CM

## 2018-01-03 MED ORDER — ONDANSETRON HCL 4 MG PO TABS: 4.0000 mg | ORAL_TABLET | Freq: Four times a day (QID) | ORAL | 0 refills | Status: DC

## 2018-01-03 MED ORDER — NAPROXEN 500 MG PO TABS: 500.0000 mg | ORAL_TABLET | Freq: Two times a day (BID) | ORAL | 0 refills | Status: DC

## 2018-01-03 MED ORDER — KETOROLAC TROMETHAMINE 30 MG/ML IJ SOLN
30.0000 mg | Freq: Once | INTRAMUSCULAR | Status: AC
  Administered 2018-01-03: 30 mg via INTRAMUSCULAR

## 2018-01-03 MED ORDER — ONDANSETRON HCL 4 MG/2ML IJ SOLN
4.0000 mg | Freq: Once | INTRAMUSCULAR | Status: AC
  Administered 2018-01-03: 4 mg via INTRAMUSCULAR

## 2018-01-03 MED ORDER — ONDANSETRON HCL 4 MG/2ML IJ SOLN
INTRAMUSCULAR | Status: AC
  Filled 2018-01-03: qty 2

## 2018-01-03 MED ORDER — KETOROLAC TROMETHAMINE 30 MG/ML IJ SOLN
INTRAMUSCULAR | Status: AC
Start: 2018-01-03 — End: 2018-01-03
  Filled 2018-01-03: qty 1

## 2018-01-03 NOTE — ED Provider Notes (Signed)
[facility]    CSN: [id number] Arrival date & time: 01/03/18  0807     History   Chief Complaint Chief Complaint  Patient presents with  . Headache    HPI Chelsea Frazier is a 22 y.o. female.   Patient is a 22 year old female with past medical history of anxiety and gastroparesis.  She comes in today for headache since last night.  The headache has been constant and worsening.  She describes it as throbbing and aching. This started after she was at work yesterday and hit her head on a hard drive at work.  The headache started about 5 PM, she went to bed and then woke up at 5 AM due to the headache and then vomited one time.  The headache is located throughout her entire head.  She has not taken anything for headache.  She is currently tearing and feels slightly nauseous.  She denies any dizziness, blurred vision.  She is having slight photophobia but no phonophobia.  ROS per HPI      Past Medical History:  Diagnosis Date  . Anxiety   . Gastroparesis     Patient Active Problem List   Diagnosis Date Noted  . Viral URI with cough 06/17/2014    Past Surgical History:  Procedure Laterality Date  . CHOLECYSTECTOMY      OB History    Gravida  0   Para  0   Term  0   Preterm  0   AB  0   Living  0     SAB  0   TAB  0   Ectopic  0   Multiple  0   Live Births               Home Medications    Prior to Admission medications   Medication Sig Start Date End Date Taking? Authorizing Provider  naproxen (NAPROSYN) 500 MG tablet Take 1 tablet (500 mg total) by mouth 2 (two) times daily. 01/03/18   Herminia Warren, Gloris Manchester A, NP  ondansetron (ZOFRAN) 4 MG tablet Take 1 tablet (4 mg total) by mouth every 6 (six) hours. 01/03/18   Janace Aris, NP    Family History History reviewed. No pertinent family history.  Social History Social History   Tobacco Use  . Smoking status: Never Smoker  . Smokeless tobacco: Never Used  Substance Use Topics  . Alcohol  use: Yes    Comment: occ  . Drug use: Yes    Types: Marijuana     Allergies   Patient has no known allergies.   Review of Systems Review of Systems   Physical Exam Triage Vital Signs ED Triage Vitals  Enc Vitals Group     BP 01/03/18 0822 130/76     Pulse Rate 01/03/18 0822 62     Resp 01/03/18 0822 16     Temp 01/03/18 0822 98.2 F (36.8 C)     Temp Source 01/03/18 0822 Oral     SpO2 01/03/18 0822 97 %     Weight 01/03/18 0824 110 lb (49.9 kg)     Height --      Head Circumference --      Peak Flow --      Pain Score 01/03/18 0824 7     Pain Loc --      Pain Edu? --      Excl. in GC? --    No data found.  Updated Vital Signs BP 130/76  Pulse 62   Temp 98.2 F (36.8 C) (Oral)   Resp 16   Wt 110 lb (49.9 kg)   LMP 01/03/2018   SpO2 97%   BMI 17.23 kg/m   Visual Acuity Right Eye Distance:   Left Eye Distance:   Bilateral Distance:    Right Eye Near:   Left Eye Near:    Bilateral Near:     Physical Exam  Constitutional: She is oriented to person, place, and time. She appears well-developed and well-nourished.  Non-toxic appearance. She appears ill. No distress.  HENT:  Head: Normocephalic and atraumatic.  Eyes: Pupils are equal, round, and reactive to light. EOM are normal.  Neck: Normal range of motion.  Cardiovascular: Normal rate and normal heart sounds.  Pulmonary/Chest: Effort normal.  Neurological: She is alert and oriented to person, place, and time. She has normal strength. She is not disoriented. No cranial nerve deficit or sensory deficit.  Skin: Skin is warm and dry. Capillary refill takes less than 2 seconds.  Psychiatric: She has a normal mood and affect.  Nursing note and vitals reviewed.    UC Treatments / Results  Labs (all labs ordered are listed, but only abnormal results are displayed) Labs Reviewed - No data to display  EKG None  Radiology No results found.  Procedures Procedures (including critical care  time)  Medications Ordered in UC Medications  ketorolac (TORADOL) 30 MG/ML injection 30 mg (30 mg Intramuscular Given 01/03/18 0853)  ondansetron (ZOFRAN) injection 4 mg (4 mg Intramuscular Given 01/03/18 0852)    Initial Impression / Assessment and Plan / UC Course  I have reviewed the triage vital signs and the nursing notes.  Pertinent labs & imaging results that were available during my care of the patient were reviewed by me and considered in my medical decision making (see chart for details).  No focal neuro deficits on physical exam.  Patient with bilateral scleral injection and tearing due to the pain.  No history of migraines  Will try Toradol injection in the clinic and Zofran for nausea to see if this helps.  Patient pain decreased and nausea decreased from medication.  Safe to discharge home.  Return precautions given if patient develops more severe headache, vomiting, vision changes or dizziness to go to the ER.  Patient agreeable to plan Final Clinical Impressions(s) / UC Diagnoses   Final diagnoses:  Acute nonintractable headache, unspecified headache type     Discharge Instructions     It was nice meeting you!!  I am glad that the medication is helping. This is a good sign.  We will give you some naproxen and Zofran to use as needed for headache and nausea.  If you develop any worsening signs or symptoms to include severe headache, dizziness, blurred vision, nausea or vomiting please go to the ER.     ED Prescriptions    Medication Sig Dispense Auth. Provider   ondansetron (ZOFRAN) 4 MG tablet Take 1 tablet (4 mg total) by mouth every 6 (six) hours. 12 tablet Halley Shepheard A, NP   naproxen (NAPROSYN) 500 MG tablet Take 1 tablet (500 mg total) by mouth 2 (two) times daily. 30 tablet Dahlia ByesBast, Nishanth Mccaughan A, NP     Controlled Substance Prescriptions [place] Controlled Substance Registry consulted? Not Applicable   Janace ArisBast, Ibrahima Holberg A, NP 01/03/18 1454

## 2018-01-03 NOTE — ED Triage Notes (Signed)
Headache started last night it's getting worst.

## 2018-01-03 NOTE — Discharge Instructions (Addendum)
It was nice meeting you!!  I am glad that the medication is helping. This is a good sign.  We will give you some naproxen and Zofran to use as needed for headache and nausea.  If you develop any worsening signs or symptoms to include severe headache, dizziness, blurred vision, nausea or vomiting please go to the ER.

## 2018-01-14 NOTE — Progress Notes (Signed)
Chief Complaint  Patient presents with  . New Patient (Initial Visit)    establish care.   Epitaxis x 3 weeks ago, past out for a minute but immediately came to, nose bleed profusely for 15 mins and afterwards really bad ha's.  Per pt has been seen by MD in a long time.    Subjective:  Chelsea Frazier is a 22 y.o. female here for a health maintenance visit.  Patient is new pt to establish care and to get a physical exam  She had a nose bleeding 3 weeks ago and felt faint and after had bad headaches. She would like a check up.   Patient Active Problem List   Diagnosis Date Noted  . Viral URI with cough 06/17/2014    Past Medical History:  Diagnosis Date  . Anxiety   . Gastroparesis     Past Surgical History:  Procedure Laterality Date  . CHOLECYSTECTOMY       Outpatient Medications Prior to Visit  Medication Sig Dispense Refill  . naproxen (NAPROSYN) 500 MG tablet Take 1 tablet (500 mg total) by mouth 2 (two) times daily. (Patient not taking: Reported on 01/15/2018) 30 tablet 0  . ondansetron (ZOFRAN) 4 MG tablet Take 1 tablet (4 mg total) by mouth every 6 (six) hours. (Patient not taking: Reported on 01/15/2018) 12 tablet 0   No facility-administered medications prior to visit.     No Known Allergies   Family History  Problem Relation Age of Onset  . Celiac disease Mother   . Heart disease Paternal Grandmother      Health Habits: Dental Exam: not up to date Eye Exam: not up to date Exercise:  times/week on average Current exercise activities: walking/running Diet:   Social History   Socioeconomic History  . Marital status: Single    Spouse name: Not on file  . Number of children: Not on file  . Years of education: Not on file  . Highest education level: Not on file  Occupational History  . Occupation: [employer]   Social Needs  . Financial resource strain: Not hard at all  . Food insecurity:    Worry: Never true    Inability: Never true  . Transportation  needs:    Medical: No    Non-medical: No  Tobacco Use  . Smoking status: Never Smoker  . Smokeless tobacco: Never Used  Substance and Sexual Activity  . Alcohol use: Yes    Comment: occ  . Drug use: Yes    Types: Marijuana  . Sexual activity: Not Currently    Partners: Female    Birth control/protection: None  Lifestyle  . Physical activity:    Days per week: 0 days    Minutes per session: Not on file  . Stress: Only a little  Relationships  . Social connections:    Talks on phone: Three times a week    Gets together: Three times a week    Attends religious service: Never    Active member of club or organization: No    Attends meetings of clubs or organizations: Never    Relationship status: Not on file  . Intimate partner violence:    Fear of current or ex partner: No    Emotionally abused: No    Physically abused: No    Forced sexual activity: No  Other Topics Concern  . Not on file  Social History Narrative  . Not on file   Social History   Substance and  Sexual Activity  Alcohol Use Yes   Comment: occ   Social History   Tobacco Use  Smoking Status Never Smoker  Smokeless Tobacco Never Used   Social History   Substance and Sexual Activity  Drug Use Yes  . Types: Marijuana    GYN: Sexual Health Menstrual status: regular menses LMP: Patient's last menstrual period was 01/03/2018. Last pap smear: see HM section History of abnormal pap smears:  Sexually active:  with  partner Current contraception:   Health Maintenance: See under health Maintenance activity for review of completion dates as well. Immunization History  Administered Date(s) Administered  . Tdap 01/15/2018      Depression Screen-PHQ2/9 Depression screen [facility] 2/9 01/15/2018 06/17/2014  Decreased Interest 0 0  Down, Depressed, Hopeless 0 0  PHQ - 2 Score 0 0       Depression Severity and Treatment Recommendations:  0-4= None  5-9= Mild / Treatment: Support, educate to call if  worse; return in one month  10-14= Moderate / Treatment: Support, watchful waiting; Antidepressant or Psycotherapy  15-19= Moderately severe / Treatment: Antidepressant OR Psychotherapy  >= 20 = Major depression, severe / Antidepressant AND Psychotherapy    Review of Systems   Review of Systems  Constitutional: Negative for chills, fever, malaise/fatigue and weight loss.  Eyes: Negative for blurred vision and double vision.  Respiratory: Negative for cough, shortness of breath and wheezing.   Cardiovascular: Negative for chest pain, palpitations and orthopnea.  Gastrointestinal: Negative for abdominal pain, nausea and vomiting.  Genitourinary: Negative for dysuria and urgency.  Musculoskeletal: Negative for myalgias and neck pain.  Skin: Negative for itching and rash.  Neurological: Negative for dizziness, tingling and headaches.  Psychiatric/Behavioral: Negative for depression. The patient does not have insomnia.     See HPI for ROS as well.    Objective:   Vitals:   01/15/18 1346  BP: 100/60  Pulse: 87  Resp: 17  Temp: 98.6 F (37 C)  TempSrc: Oral  SpO2: 96%  Weight: 109 lb 6.4 oz (49.6 kg)  Height: '5\' 7"'  (1.702 m)    Body mass index is 17.13 kg/m.  Wt Readings from Last 3 Encounters:  01/15/18 109 lb 6.4 oz (49.6 kg)  01/03/18 110 lb (49.9 kg)  06/16/17 115 lb (52.2 kg)    Physical Exam  Constitutional: She is oriented to person, place, and time. She appears well-developed and well-nourished.  HENT:  Head: Normocephalic and atraumatic.  Eyes: Conjunctivae and EOM are normal.  Neck: Normal range of motion. Neck supple. No thyromegaly present.  Cardiovascular: Normal rate, regular rhythm and normal heart sounds.  No murmur heard. Pulmonary/Chest: Effort normal and breath sounds normal. No stridor. No respiratory distress. She has no wheezes.  Abdominal: Soft. Bowel sounds are normal. She exhibits no distension. There is no tenderness. There is no guarding.   Musculoskeletal: Normal range of motion. She exhibits no edema.  Neurological: She is alert and oriented to person, place, and time.  Skin: Skin is warm. Capillary refill takes less than 2 seconds.  Psychiatric: She has a normal mood and affect. Her behavior is normal. Judgment and thought content normal.       Assessment/Plan:   Patient was seen for a health maintenance exam.  Counseled the patient on health maintenance issues. Reviewed her health mainteance schedule and ordered appropriate tests (see orders.) Counseled on regular exercise and weight management. Recommend regular eye exams and dental cleaning.   The following issues were addressed today for health  maintenance:   Aleea was seen today for new patient (initial visit).  Diagnoses and all orders for this visit:  Encounter for health maintenance examination in adult - pt to return for pap smear Reviewed age appropriate immunizations and screenings  Epistaxis- only one isolated episode -     CBC -     Basic metabolic panel  Need for Tdap vaccination -     Tdap vaccine greater than or equal to 7yo IM  Screening for thyroid disorder -     Basic metabolic panel -     TSH  Screening, lipid -     TSH -     Lipid panel  Screen for STD (sexually transmitted disease) -     Hepatitis B surface antigen -     HIV antibody -     RPR    No follow-ups on file.    Body mass index is 17.13 kg/m.:  Discussed the patient's BMI with patient. The BMI body mass index is 17.13 kg/m.     No future appointments.  Patient Instructions       I will contact you with your lab results within the next 2 weeks.  If you have not heard from [place] then please contact us. The fastest way to get your results is to register for My Chart.   IF you received an x-ray today, you will receive an invoice from [facility]. Please contact [facility] at [phone] with questions or concerns regarding your invoice.    IF you received labwork today, you will receive an invoice from [facility]. Please contact [facility] at [phone] with questions or concerns regarding your invoice.   Our billing staff will not be able to assist you with questions regarding bills from these companies.  You will be contacted with the lab results as soon as they are available. The fastest way to get your results is to activate your My Chart account. Instructions are located on the last page of this paperwork. If you have not heard from [place] regarding the results in 2 weeks, please contact this office.    Health Maintenance, Female Adopting a healthy lifestyle and getting preventive care can go a long way to promote health and wellness. Talk with your health care provider about what schedule of regular examinations is right for you. This is a good chance for you to check in with your provider about disease prevention and staying healthy. In between checkups, there are plenty of things you can do on your own. Experts have done a lot of research about which lifestyle changes and preventive measures are most likely to keep you healthy. Ask your health care provider for more information. Weight and diet Eat a healthy diet  Be sure to include plenty of vegetables, fruits, low-fat dairy products, and lean protein.  Do not eat a lot of foods high in solid fats, added sugars, or salt.  Get regular exercise. This is one of the most important things you can do for your health. ? Most adults should exercise for at least 150 minutes each week. The exercise should increase your heart rate and make you sweat (moderate-intensity exercise). ? Most adults should also do strengthening exercises at least twice a week. This is in addition to the moderate-intensity exercise.  Maintain a healthy weight  Body mass index (BMI) is a measurement that can be used to identify possible weight problems. It estimates body fat based on height and weight. Your  health care provider  can help determine your BMI and help you achieve or maintain a healthy weight.  For females 70 years of age and older: ? A BMI below 18.5 is considered underweight. ? A BMI of 18.5 to 24.9 is normal. ? A BMI of 25 to 29.9 is considered overweight. ? A BMI of 30 and above is considered obese.  Watch levels of cholesterol and blood lipids  You should start having your blood tested for lipids and cholesterol at 24 years of age, then have this test every 5 years.  You may need to have your cholesterol levels checked more often if: ? Your lipid or cholesterol levels are high. ? You are older than 24 years of age. ? You are at high risk for heart disease.  Cancer screening Lung Cancer  Lung cancer screening is recommended for adults 76-52 years old who are at high risk for lung cancer because of a history of smoking.  A yearly low-dose CT scan of the lungs is recommended for people who: ? Currently smoke. ? Have quit within the past 15 years. ? Have at least a 30-pack-year history of smoking. A pack year is smoking an average of one pack of cigarettes a day for 1 year.  Yearly screening should continue until it has been 15 years since you quit.  Yearly screening should stop if you develop a health problem that would prevent you from having lung cancer treatment.  Breast Cancer  Practice breast self-awareness. This means understanding how your breasts normally appear and feel.  It also means doing regular breast self-exams. Let your health care provider know about any changes, no matter how small.  If you are in your 20s or 30s, you should have a clinical breast exam (CBE) by a health care provider every 1-3 years as part of a regular health exam.  If you are 88 or older, have a CBE every year. Also consider having a breast X-ray (mammogram) every year.  If you have a family history of breast cancer, talk to your health care provider about genetic  screening.  If you are at high risk for breast cancer, talk to your health care provider about having an MRI and a mammogram every year.  Breast cancer gene (BRCA) assessment is recommended for women who have family members with BRCA-related cancers. BRCA-related cancers include: ? Breast. ? Ovarian. ? Tubal. ? Peritoneal cancers.  Results of the assessment will determine the need for genetic counseling and BRCA1 and BRCA2 testing.  Cervical Cancer Your health care provider may recommend that you be screened regularly for cancer of the pelvic organs (ovaries, uterus, and vagina). This screening involves a pelvic examination, including checking for microscopic changes to the surface of your cervix (Pap test). You may be encouraged to have this screening done every 3 years, beginning at age 22.  For women ages 20-65, health care providers may recommend pelvic exams and Pap testing every 3 years, or they may recommend the Pap and pelvic exam, combined with testing for human papilloma virus (HPV), every 5 years. Some types of HPV increase your risk of cervical cancer. Testing for HPV may also be done on women of any age with unclear Pap test results.  Other health care providers may not recommend any screening for nonpregnant women who are considered low risk for pelvic cancer and who do not have symptoms. Ask your health care provider if a screening pelvic exam is right for you.  If you have had past treatment  for cervical cancer or a condition that could lead to cancer, you need Pap tests and screening for cancer for at least 20 years after your treatment. If Pap tests have been discontinued, your risk factors (such as having a new sexual partner) need to be reassessed to determine if screening should resume. Some women have medical problems that increase the chance of getting cervical cancer. In these cases, your health care provider may recommend more frequent screening and Pap  tests.  Colorectal Cancer  This type of cancer can be detected and often prevented.  Routine colorectal cancer screening usually begins at 24 years of age and continues through 24 years of age.  Your health care provider may recommend screening at an earlier age if you have risk factors for colon cancer.  Your health care provider may also recommend using home test kits to check for hidden blood in the stool.  A small camera at the end of a tube can be used to examine your colon directly (sigmoidoscopy or colonoscopy). This is done to check for the earliest forms of colorectal cancer.  Routine screening usually begins at age 80.  Direct examination of the colon should be repeated every 5-10 years through 24 years of age. However, you may need to be screened more often if early forms of precancerous polyps or small growths are found.  Skin Cancer  Check your skin from head to toe regularly.  Tell your health care provider about any new moles or changes in moles, especially if there is a change in a mole's shape or color.  Also tell your health care provider if you have a mole that is larger than the size of a pencil eraser.  Always use sunscreen. Apply sunscreen liberally and repeatedly throughout the day.  Protect yourself by wearing long sleeves, pants, a wide-brimmed hat, and sunglasses whenever you are outside.  Heart disease, diabetes, and high blood pressure  High blood pressure causes heart disease and increases the risk of stroke. High blood pressure is more likely to develop in: ? People who have blood pressure in the high end of the normal range (130-139/85-89 mm Hg). ? People who are overweight or obese. ? People who are African American.  If you are 32-22 years of age, have your blood pressure checked every 3-5 years. If you are 10 years of age or older, have your blood pressure checked every year. You should have your blood pressure measured twice-once when you are at  a hospital or clinic, and once when you are not at a hospital or clinic. Record the average of the two measurements. To check your blood pressure when you are not at a hospital or clinic, you can use: ? An automated blood pressure machine at a pharmacy. ? A home blood pressure monitor.  If you are between 54 years and 40 years old, ask your health care provider if you should take aspirin to prevent strokes.  Have regular diabetes screenings. This involves taking a blood sample to check your fasting blood sugar level. ? If you are at a normal weight and have a low risk for diabetes, have this test once every three years after 24 years of age. ? If you are overweight and have a high risk for diabetes, consider being tested at a younger age or more often. Preventing infection Hepatitis B  If you have a higher risk for hepatitis B, you should be screened for this virus. You are considered at high risk for  hepatitis B if: ? You were born in a country where hepatitis B is common. Ask your health care provider which countries are considered high risk. ? Your parents were born in a high-risk country, and you have not been immunized against hepatitis B (hepatitis B vaccine). ? You have HIV or AIDS. ? You use needles to inject street drugs. ? You live with someone who has hepatitis B. ? You have had sex with someone who has hepatitis B. ? You get hemodialysis treatment. ? You take certain medicines for conditions, including cancer, organ transplantation, and autoimmune conditions.  Hepatitis C  Blood testing is recommended for: ? Everyone born from [birth year] through 1967. ? Anyone with known risk factors for hepatitis C.  Sexually transmitted infections (STIs)  You should be screened for sexually transmitted infections (STIs) including gonorrhea and chlamydia if: ? You are sexually active and are younger than 24 years of age. ? You are older than 24 years of age and your health care provider tells  you that you are at risk for this type of infection. ? Your sexual activity has changed since you were last screened and you are at an increased risk for chlamydia or gonorrhea. Ask your health care provider if you are at risk.  If you do not have HIV, but are at risk, it may be recommended that you take a prescription medicine daily to prevent HIV infection. This is called pre-exposure prophylaxis (PrEP). You are considered at risk if: ? You are sexually active and do not regularly use condoms or know the HIV status of your partner(s). ? You take drugs by injection. ? You are sexually active with a partner who has HIV.  Talk with your health care provider about whether you are at high risk of being infected with HIV. If you choose to begin PrEP, you should first be tested for HIV. You should then be tested every 3 months for as long as you are taking PrEP. Pregnancy  If you are premenopausal and you may become pregnant, ask your health care provider about preconception counseling.  If you may become pregnant, take 400 to 800 micrograms (mcg) of folic acid every day.  If you want to prevent pregnancy, talk to your health care provider about birth control (contraception). Osteoporosis and menopause  Osteoporosis is a disease in which the bones lose minerals and strength with aging. This can result in serious bone fractures. Your risk for osteoporosis can be identified using a bone density scan.  If you are 48 years of age or older, or if you are at risk for osteoporosis and fractures, ask your health care provider if you should be screened.  Ask your health care provider whether you should take a calcium or vitamin D supplement to lower your risk for osteoporosis.  Menopause may have certain physical symptoms and risks.  Hormone replacement therapy may reduce some of these symptoms and risks. Talk to your health care provider about whether hormone replacement therapy is right for  you. Follow these instructions at home:  Schedule regular health, dental, and eye exams.  Stay current with your immunizations.  Do not use any tobacco products including cigarettes, chewing tobacco, or electronic cigarettes.  If you are pregnant, do not drink alcohol.  If you are breastfeeding, limit how much and how often you drink alcohol.  Limit alcohol intake to no more than 1 drink per day for nonpregnant women. One drink equals 12 ounces of beer, 5 ounces of wine,  or 1 ounces of hard liquor.  Do not use street drugs.  Do not share needles.  Ask your health care provider for help if you need support or information about quitting drugs.  Tell your health care provider if you often feel depressed.  Tell your health care provider if you have ever been abused or do not feel safe at home. This information is not intended to replace advice given to you by your health care provider. Make sure you discuss any questions you have with your health care provider. Document Released: 12/04/2010 Document Revised: 10/27/2015 Document Reviewed: 02/22/2015 Elsevier Interactive Patient Education  [employer].

## 2018-01-15 ENCOUNTER — Ambulatory Visit (INDEPENDENT_AMBULATORY_CARE_PROVIDER_SITE_OTHER): Admitting: Family Medicine

## 2018-01-15 ENCOUNTER — Encounter: Payer: Self-pay | Admitting: Family Medicine

## 2018-01-15 ENCOUNTER — Other Ambulatory Visit: Payer: Self-pay

## 2018-01-15 VITALS — BP 100/60 | HR 87 | Temp 98.6°F | Resp 17 | Ht 67.0 in | Wt 109.4 lb

## 2018-01-15 DIAGNOSIS — Z13220 Encounter for screening for lipoid disorders: Secondary | ICD-10-CM | POA: Diagnosis not present

## 2018-01-15 DIAGNOSIS — Z23 Encounter for immunization: Secondary | ICD-10-CM

## 2018-01-15 DIAGNOSIS — Z113 Encounter for screening for infections with a predominantly sexual mode of transmission: Secondary | ICD-10-CM | POA: Diagnosis not present

## 2018-01-15 DIAGNOSIS — Z0000 Encounter for general adult medical examination without abnormal findings: Secondary | ICD-10-CM

## 2018-01-15 DIAGNOSIS — Z1329 Encounter for screening for other suspected endocrine disorder: Secondary | ICD-10-CM | POA: Diagnosis not present

## 2018-01-15 DIAGNOSIS — R040 Epistaxis: Secondary | ICD-10-CM

## 2018-01-15 NOTE — Patient Instructions (Addendum)
I will contact you with your lab results within the next 2 weeks.  If you have not heard from [place] then please contact us. The fastest way to get your results is to register for My Chart.   IF you received an x-ray today, you will receive an invoice from [facility]. Please contact [facility] at [phone] with questions or concerns regarding your invoice.   IF you received labwork today, you will receive an invoice from [place]. Please contact [facility] at [phone] with questions or concerns regarding your invoice.   Our billing staff will not be able to assist you with questions regarding bills from these companies.  You will be contacted with the lab results as soon as they are available. The fastest way to get your results is to activate your My Chart account. Instructions are located on the last page of this paperwork. If you have not heard from [place] regarding the results in 2 weeks, please contact this office.    Health Maintenance, Female Adopting a healthy lifestyle and getting preventive care can go a long way to promote health and wellness. Talk with your health care provider about what schedule of regular examinations is right for you. This is a good chance for you to check in with your provider about disease prevention and staying healthy. In between checkups, there are plenty of things you can do on your own. Experts have done a lot of research about which lifestyle changes and preventive measures are most likely to keep you healthy. Ask your health care provider for more information. Weight and diet Eat a healthy diet  Be sure to include plenty of vegetables, fruits, low-fat dairy products, and lean protein.  Do not eat a lot of foods high in solid fats, added sugars, or salt.  Get regular exercise. This is one of the most important things you can do for your health. ? Most adults should exercise for at least 150 minutes each week. The exercise should  increase your heart rate and make you sweat (moderate-intensity exercise). ? Most adults should also do strengthening exercises at least twice a week. This is in addition to the moderate-intensity exercise.  Maintain a healthy weight  Body mass index (BMI) is a measurement that can be used to identify possible weight problems. It estimates body fat based on height and weight. Your health care provider can help determine your BMI and help you achieve or maintain a healthy weight.  For females 56 years of age and older: ? A BMI below 18.5 is considered underweight. ? A BMI of 18.5 to 24.9 is normal. ? A BMI of 25 to 29.9 is considered overweight. ? A BMI of 30 and above is considered obese.  Watch levels of cholesterol and blood lipids  You should start having your blood tested for lipids and cholesterol at 24 years of age, then have this test every 5 years.  You may need to have your cholesterol levels checked more often if: ? Your lipid or cholesterol levels are high. ? You are older than 24 years of age. ? You are at high risk for heart disease.  Cancer screening Lung Cancer  Lung cancer screening is recommended for adults 17-79 years old who are at high risk for lung cancer because of a history of smoking.  A yearly low-dose CT scan of the lungs is recommended for people who: ? Currently smoke. ? Have quit within the past 15 years. ? Have at  least a 30-pack-year history of smoking. A pack year is smoking an average of one pack of cigarettes a day for 1 year.  Yearly screening should continue until it has been 15 years since you quit.  Yearly screening should stop if you develop a health problem that would prevent you from having lung cancer treatment.  Breast Cancer  Practice breast self-awareness. This means understanding how your breasts normally appear and feel.  It also means doing regular breast self-exams. Let your health care provider know about any changes, no matter  how small.  If you are in your 20s or 30s, you should have a clinical breast exam (CBE) by a health care provider every 1-3 years as part of a regular health exam.  If you are 26 or older, have a CBE every year. Also consider having a breast X-ray (mammogram) every year.  If you have a family history of breast cancer, talk to your health care provider about genetic screening.  If you are at high risk for breast cancer, talk to your health care provider about having an MRI and a mammogram every year.  Breast cancer gene (BRCA) assessment is recommended for women who have family members with BRCA-related cancers. BRCA-related cancers include: ? Breast. ? Ovarian. ? Tubal. ? Peritoneal cancers.  Results of the assessment will determine the need for genetic counseling and BRCA1 and BRCA2 testing.  Cervical Cancer Your health care provider may recommend that you be screened regularly for cancer of the pelvic organs (ovaries, uterus, and vagina). This screening involves a pelvic examination, including checking for microscopic changes to the surface of your cervix (Pap test). You may be encouraged to have this screening done every 3 years, beginning at age 13.  For women ages 32-65, health care providers may recommend pelvic exams and Pap testing every 3 years, or they may recommend the Pap and pelvic exam, combined with testing for human papilloma virus (HPV), every 5 years. Some types of HPV increase your risk of cervical cancer. Testing for HPV may also be done on women of any age with unclear Pap test results.  Other health care providers may not recommend any screening for nonpregnant women who are considered low risk for pelvic cancer and who do not have symptoms. Ask your health care provider if a screening pelvic exam is right for you.  If you have had past treatment for cervical cancer or a condition that could lead to cancer, you need Pap tests and screening for cancer for at least 20  years after your treatment. If Pap tests have been discontinued, your risk factors (such as having a new sexual partner) need to be reassessed to determine if screening should resume. Some women have medical problems that increase the chance of getting cervical cancer. In these cases, your health care provider may recommend more frequent screening and Pap tests.  Colorectal Cancer  This type of cancer can be detected and often prevented.  Routine colorectal cancer screening usually begins at 24 years of age and continues through 24 years of age.  Your health care provider may recommend screening at an earlier age if you have risk factors for colon cancer.  Your health care provider may also recommend using home test kits to check for hidden blood in the stool.  A small camera at the end of a tube can be used to examine your colon directly (sigmoidoscopy or colonoscopy). This is done to check for the earliest forms of colorectal cancer.  Routine screening usually begins at age 23.  Direct examination of the colon should be repeated every 5-10 years through 24 years of age. However, you may need to be screened more often if early forms of precancerous polyps or small growths are found.  Skin Cancer  Check your skin from head to toe regularly.  Tell your health care provider about any new moles or changes in moles, especially if there is a change in a mole's shape or color.  Also tell your health care provider if you have a mole that is larger than the size of a pencil eraser.  Always use sunscreen. Apply sunscreen liberally and repeatedly throughout the day.  Protect yourself by wearing long sleeves, pants, a wide-brimmed hat, and sunglasses whenever you are outside.  Heart disease, diabetes, and high blood pressure  High blood pressure causes heart disease and increases the risk of stroke. High blood pressure is more likely to develop in: ? People who have blood pressure in the high  end of the normal range (130-139/85-89 mm Hg). ? People who are overweight or obese. ? People who are African American.  If you are 56-61 years of age, have your blood pressure checked every 3-5 years. If you are 37 years of age or older, have your blood pressure checked every year. You should have your blood pressure measured twice-once when you are at a hospital or clinic, and once when you are not at a hospital or clinic. Record the average of the two measurements. To check your blood pressure when you are not at a hospital or clinic, you can use: ? An automated blood pressure machine at a pharmacy. ? A home blood pressure monitor.  If you are between 41 years and 41 years old, ask your health care provider if you should take aspirin to prevent strokes.  Have regular diabetes screenings. This involves taking a blood sample to check your fasting blood sugar level. ? If you are at a normal weight and have a low risk for diabetes, have this test once every three years after 24 years of age. ? If you are overweight and have a high risk for diabetes, consider being tested at a younger age or more often. Preventing infection Hepatitis B  If you have a higher risk for hepatitis B, you should be screened for this virus. You are considered at high risk for hepatitis B if: ? You were born in a country where hepatitis B is common. Ask your health care provider which countries are considered high risk. ? Your parents were born in a high-risk country, and you have not been immunized against hepatitis B (hepatitis B vaccine). ? You have HIV or AIDS. ? You use needles to inject street drugs. ? You live with someone who has hepatitis B. ? You have had sex with someone who has hepatitis B. ? You get hemodialysis treatment. ? You take certain medicines for conditions, including cancer, organ transplantation, and autoimmune conditions.  Hepatitis C  Blood testing is recommended for: ? Everyone born from  [birth year] through 1967. ? Anyone with known risk factors for hepatitis C.  Sexually transmitted infections (STIs)  You should be screened for sexually transmitted infections (STIs) including gonorrhea and chlamydia if: ? You are sexually active and are younger than 24 years of age. ? You are older than 24 years of age and your health care provider tells you that you are at risk for this type of infection. ? Your sexual activity  has changed since you were last screened and you are at an increased risk for chlamydia or gonorrhea. Ask your health care provider if you are at risk.  If you do not have HIV, but are at risk, it may be recommended that you take a prescription medicine daily to prevent HIV infection. This is called pre-exposure prophylaxis (PrEP). You are considered at risk if: ? You are sexually active and do not regularly use condoms or know the HIV status of your partner(s). ? You take drugs by injection. ? You are sexually active with a partner who has HIV.  Talk with your health care provider about whether you are at high risk of being infected with HIV. If you choose to begin PrEP, you should first be tested for HIV. You should then be tested every 3 months for as long as you are taking PrEP. Pregnancy  If you are premenopausal and you may become pregnant, ask your health care provider about preconception counseling.  If you may become pregnant, take 400 to 800 micrograms (mcg) of folic acid every day.  If you want to prevent pregnancy, talk to your health care provider about birth control (contraception). Osteoporosis and menopause  Osteoporosis is a disease in which the bones lose minerals and strength with aging. This can result in serious bone fractures. Your risk for osteoporosis can be identified using a bone density scan.  If you are 39 years of age or older, or if you are at risk for osteoporosis and fractures, ask your health care provider if you should be  screened.  Ask your health care provider whether you should take a calcium or vitamin D supplement to lower your risk for osteoporosis.  Menopause may have certain physical symptoms and risks.  Hormone replacement therapy may reduce some of these symptoms and risks. Talk to your health care provider about whether hormone replacement therapy is right for you. Follow these instructions at home:  Schedule regular health, dental, and eye exams.  Stay current with your immunizations.  Do not use any tobacco products including cigarettes, chewing tobacco, or electronic cigarettes.  If you are pregnant, do not drink alcohol.  If you are breastfeeding, limit how much and how often you drink alcohol.  Limit alcohol intake to no more than 1 drink per day for nonpregnant women. One drink equals 12 ounces of beer, 5 ounces of wine, or 1 ounces of hard liquor.  Do not use street drugs.  Do not share needles.  Ask your health care provider for help if you need support or information about quitting drugs.  Tell your health care provider if you often feel depressed.  Tell your health care provider if you have ever been abused or do not feel safe at home. This information is not intended to replace advice given to you by your health care provider. Make sure you discuss any questions you have with your health care provider. Document Released: 12/04/2010 Document Revised: 10/27/2015 Document Reviewed: 02/22/2015 Elsevier Interactive Patient Education  [employer].

## 2018-01-16 LAB — BASIC METABOLIC PANEL
BUN / CREAT RATIO: 19 (ref 9–23)
BUN: 14 mg/dL (ref 6–20)
CO2: 22 mmol/L (ref 20–29)
CREATININE: 0.74 mg/dL (ref 0.57–1.00)
Calcium: 9.5 mg/dL (ref 8.7–10.2)
Chloride: 103 mmol/L (ref 96–106)
GFR, EST AFRICAN AMERICAN: 131 mL/min/{1.73_m2} (ref >59)
GFR, EST NON AFRICAN AMERICAN: 114 mL/min/{1.73_m2} (ref >59)
Glucose: 82 mg/dL (ref 65–99)
Potassium: 4.3 mmol/L (ref 3.5–5.2)
Sodium: 139 mmol/L (ref 134–144)

## 2018-01-16 LAB — LIPID PANEL
Chol/HDL Ratio: 2.3 ratio (ref 0.0–4.4)
Cholesterol, Total: 143 mg/dL (ref 100–199)
HDL: 63 mg/dL (ref >39)
LDL CALC: 69 mg/dL (ref 0–99)
Triglycerides: 57 mg/dL (ref 0–149)
VLDL CHOLESTEROL CAL: 11 mg/dL (ref 5–40)

## 2018-01-16 LAB — CBC
HEMATOCRIT: 39.1 % (ref 34.0–46.6)
HEMOGLOBIN: 12.9 g/dL (ref 11.1–15.9)
MCH: 30.3 pg (ref 26.6–33.0)
MCHC: 33 g/dL (ref 31.5–35.7)
MCV: 92 fL (ref 79–97)
Platelets: 257 10*3/uL (ref 150–450)
RBC: 4.26 x10E6/uL (ref 3.77–5.28)
RDW: 11.9 % — ABNORMAL LOW (ref 12.3–15.4)
WBC: 7.6 10*3/uL (ref 3.4–10.8)

## 2018-01-16 LAB — TSH: TSH: 1.15 u[IU]/mL (ref 0.450–4.500)

## 2018-01-16 LAB — HIV ANTIBODY (ROUTINE TESTING W REFLEX): HIV SCREEN 4TH GENERATION: NONREACTIVE

## 2018-01-16 LAB — RPR: RPR Ser Ql: NONREACTIVE

## 2018-01-16 LAB — HEPATITIS B SURFACE ANTIGEN: Hepatitis B Surface Ag: NEGATIVE

## 2019-03-14 ENCOUNTER — Emergency Department (HOSPITAL_COMMUNITY)
Admission: EM | Admit: 2019-03-14 | Discharge: 2019-03-14 | Disposition: A | Discharge status: Home / Self Care | Attending: Emergency Medicine | Admitting: Emergency Medicine

## 2019-03-14 ENCOUNTER — Encounter (HOSPITAL_COMMUNITY): Payer: Self-pay | Admitting: *Deleted

## 2019-03-14 ENCOUNTER — Other Ambulatory Visit: Payer: Self-pay

## 2019-03-14 ENCOUNTER — Emergency Department (HOSPITAL_COMMUNITY)

## 2019-03-14 DIAGNOSIS — R1031 Right lower quadrant pain: Secondary | ICD-10-CM | POA: Insufficient documentation

## 2019-03-14 DIAGNOSIS — R112 Nausea with vomiting, unspecified: Secondary | ICD-10-CM

## 2019-03-14 LAB — COMPREHENSIVE METABOLIC PANEL
ALT: 23 U/L (ref 0–44)
AST: 19 U/L (ref 15–41)
Albumin: 4.9 g/dL (ref 3.5–5.0)
Alkaline Phosphatase: 31 U/L — ABNORMAL LOW (ref 38–126)
Anion gap: 13 (ref 5–15)
BUN: 13 mg/dL (ref 6–20)
CO2: 20 mmol/L — ABNORMAL LOW (ref 22–32)
Calcium: 9.1 mg/dL (ref 8.9–10.3)
Chloride: 109 mmol/L (ref 98–111)
Creatinine, Ser: 0.57 mg/dL (ref 0.44–1.00)
GFR calc Af Amer: >60 mL/min (ref >60)
GFR calc non Af Amer: >60 mL/min (ref >60)
Glucose, Bld: 157 mg/dL — ABNORMAL HIGH (ref 70–99)
Potassium: 3.6 mmol/L (ref 3.5–5.1)
Sodium: 142 mmol/L (ref 135–145)
Total Bilirubin: 0.9 mg/dL (ref 0.3–1.2)
Total Protein: 7.6 g/dL (ref 6.5–8.1)

## 2019-03-14 LAB — CBC
HCT: 37.1 % (ref 36.0–46.0)
Hemoglobin: 12.3 g/dL (ref 12.0–15.0)
MCH: 31.8 pg (ref 26.0–34.0)
MCHC: 33.2 g/dL (ref 30.0–36.0)
MCV: 95.9 fL (ref 80.0–100.0)
Platelets: 264 10*3/uL (ref 150–400)
RBC: 3.87 MIL/uL (ref 3.87–5.11)
RDW: 12.3 % (ref 11.5–15.5)
WBC: 14.5 10*3/uL — ABNORMAL HIGH (ref 4.0–10.5)
nRBC: 0 % (ref 0.0–0.2)

## 2019-03-14 LAB — URINALYSIS, ROUTINE W REFLEX MICROSCOPIC
Bilirubin Urine: NEGATIVE
Glucose, UA: NEGATIVE mg/dL
Hgb urine dipstick: NEGATIVE
Ketones, ur: NEGATIVE mg/dL
Leukocytes,Ua: NEGATIVE
Nitrite: NEGATIVE
Protein, ur: NEGATIVE mg/dL
Specific Gravity, Urine: 1.017 (ref 1.005–1.030)
pH: 7 (ref 5.0–8.0)

## 2019-03-14 LAB — LIPASE, BLOOD: Lipase: 26 U/L (ref 11–51)

## 2019-03-14 LAB — I-STAT BETA HCG BLOOD, ED (MC, WL, AP ONLY): I-stat hCG, quantitative: <5 m[IU]/mL (ref <5)

## 2019-03-14 MED ORDER — SODIUM CHLORIDE 0.9% FLUSH
3.0000 mL | Freq: Once | INTRAVENOUS | Status: AC
  Administered 2019-03-14: 3 mL via INTRAVENOUS

## 2019-03-14 MED ORDER — SODIUM CHLORIDE 0.9 % IV BOLUS
1000.0000 mL | Freq: Once | INTRAVENOUS | Status: AC
  Administered 2019-03-14: 07:00:00 1000 mL via INTRAVENOUS

## 2019-03-14 MED ORDER — SODIUM CHLORIDE (PF) 0.9 % IJ SOLN
INTRAMUSCULAR | Status: AC
  Filled 2019-03-14: qty 50

## 2019-03-14 MED ORDER — IOHEXOL 300 MG/ML  SOLN
100.0000 mL | Freq: Once | INTRAMUSCULAR | Status: AC | PRN
  Administered 2019-03-14: 100 mL via INTRAVENOUS

## 2019-03-14 MED ORDER — ONDANSETRON 4 MG PO TBDP: ORAL_TABLET | ORAL | 0 refills | Status: AC

## 2019-03-14 MED ORDER — MORPHINE SULFATE (PF) 4 MG/ML IV SOLN
4.0000 mg | Freq: Once | INTRAVENOUS | Status: AC
  Administered 2019-03-14: 09:00:00 4 mg via INTRAVENOUS
  Filled 2019-03-14: qty 1

## 2019-03-14 MED ORDER — ONDANSETRON HCL 4 MG/2ML IJ SOLN
4.0000 mg | Freq: Once | INTRAMUSCULAR | Status: AC
  Administered 2019-03-14: 07:00:00 4 mg via INTRAVENOUS
  Filled 2019-03-14: qty 2

## 2019-03-14 MED ORDER — ONDANSETRON 4 MG PO TBDP
4.0000 mg | ORAL_TABLET | Freq: Once | ORAL | Status: AC | PRN
  Administered 2019-03-14: 05:00:00 4 mg via ORAL
  Filled 2019-03-14: qty 1

## 2019-03-14 MED ORDER — MORPHINE SULFATE (PF) 4 MG/ML IV SOLN
4.0000 mg | Freq: Once | INTRAVENOUS | Status: AC
  Administered 2019-03-14: 07:00:00 4 mg via INTRAVENOUS
  Filled 2019-03-14: qty 1

## 2019-03-14 NOTE — ED Notes (Signed)
Pt with dry heaves only in triage.

## 2019-03-14 NOTE — ED Provider Notes (Signed)
[facility] DEPT Provider Note   CSN: [id number] Arrival date & time: 03/14/19  8469     History   Chief Complaint Chief Complaint  Patient presents with  . Abdominal Pain    HPI Chelsea Frazier is a 23 y.o. female with a hx of anxiety, gastroparesis, cholecystectomy presents to the Emergency Department complaining of gradual, persistent, progressively worsening right lower quadrant abdominal pain onset 3 hours prior to arrival around 1 AM.  Patient reports she awoke with severe pain in almost immediate vomiting.  She has had several loose stools but no melena or hematochezia.  Patient reports a surgical history of cholecystectomy but does still have her appendix.  She reports she is not sexually active with any partners at this time.  She has no vaginal discharge or vaginal bleeding.  She denies a history of STD or PID.  No specific aggravating or alleviating factors.  No treatments prior to arrival.  Patient denies fever, chills, headache, neck pain, chest pain, shortness of breath, weakness, dizziness, syncope      The history is provided by the patient and medical records. No language interpreter was used.    Past Medical History:  Diagnosis Date  . Anxiety   . Gastroparesis     Patient Active Problem List   Diagnosis Date Noted  . Viral URI with cough 06/17/2014    Past Surgical History:  Procedure Laterality Date  . CHOLECYSTECTOMY       OB History    Gravida  0   Para  0   Term  0   Preterm  0   AB  0   Living  0     SAB  0   TAB  0   Ectopic  0   Multiple  0   Live Births               Home Medications    Prior to Admission medications   Not on File    Family History Family History  Problem Relation Age of Onset  . Celiac disease Mother   . Heart disease Paternal Grandmother     Social History Social History   Tobacco Use  . Smoking status: Never Smoker  . Smokeless tobacco: Never Used   Substance Use Topics  . Alcohol use: Yes    Comment: occ  . Drug use: Yes    Types: Marijuana     Allergies   Patient has no known allergies.   Review of Systems Review of Systems  Constitutional: Negative for appetite change, diaphoresis, fatigue, fever and unexpected weight change.  HENT: Negative for mouth sores.   Eyes: Negative for visual disturbance.  Respiratory: Negative for cough, chest tightness, shortness of breath and wheezing.   Cardiovascular: Negative for chest pain.  Gastrointestinal: Positive for abdominal pain, diarrhea, nausea and vomiting. Negative for constipation.  Endocrine: Negative for polydipsia, polyphagia and polyuria.  Genitourinary: Negative for dysuria, frequency, hematuria and urgency.  Musculoskeletal: Negative for back pain and neck stiffness.  Skin: Negative for rash.  Allergic/Immunologic: Negative for immunocompromised state.  Neurological: Negative for syncope, light-headedness and headaches.  Hematological: Does not bruise/bleed easily.  Psychiatric/Behavioral: Negative for sleep disturbance. The patient is not nervous/anxious.      Physical Exam Updated Vital Signs BP 118/62 (BP Location: Right Arm)   Pulse 94   Temp 98.9 F (37.2 C) (Oral)   Resp (!) 22   Ht 5\' 7"  (1.702 m)   Wt 49.9  kg   LMP 02/15/2019   SpO2 98%   BMI 17.23 kg/m   Physical Exam Vitals signs and nursing note reviewed.  Constitutional:      Appearance: She is not diaphoretic.     Comments: Uncomfortable, rocking in bed  HENT:     Head: Normocephalic.  Eyes:     General: No scleral icterus.    Conjunctiva/sclera: Conjunctivae normal.  Neck:     Musculoskeletal: Normal range of motion.  Cardiovascular:     Rate and Rhythm: Normal rate and regular rhythm.     Pulses: Normal pulses.          Radial pulses are 2+ on the right side and 2+ on the left side.  Pulmonary:     Effort: No tachypnea, accessory muscle usage, prolonged expiration, respiratory  distress or retractions.     Breath sounds: No stridor.     Comments: Equal chest rise. No increased work of breathing. Abdominal:     General: There is no distension.     Palpations: Abdomen is soft.     Tenderness: There is abdominal tenderness in the right lower quadrant. There is guarding. There is no right CVA tenderness, left CVA tenderness or rebound. Positive signs include McBurney's sign. Negative signs include Murphy's sign.  Musculoskeletal:     Comments: Moves all extremities equally and without difficulty.  Skin:    General: Skin is warm and dry.     Capillary Refill: Capillary refill takes less than 2 seconds.     Coloration: Skin is pale.  Neurological:     Mental Status: She is alert.     GCS: GCS eye subscore is 4. GCS verbal subscore is 5. GCS motor subscore is 6.     Comments: Speech is clear and goal oriented.  Psychiatric:        Mood and Affect: Mood normal.      ED Treatments / Results  Labs (all labs ordered are listed, but only abnormal results are displayed) Labs Reviewed  COMPREHENSIVE METABOLIC PANEL - Abnormal; Notable for the following components:      Result Value   CO2 20 (*)    Glucose, Bld 157 (*)    Alkaline Phosphatase 31 (*)    All other components within normal limits  CBC - Abnormal; Notable for the following components:   WBC 14.5 (*)    All other components within normal limits  LIPASE, BLOOD  URINALYSIS, ROUTINE W REFLEX MICROSCOPIC  I-STAT BETA HCG BLOOD, ED ([facility], [facility], [facility] ONLY)     Procedures Procedures (including critical care time)  Medications Ordered in ED Medications  sodium chloride flush (NS) 0.9 % injection 3 mL (3 mLs Intravenous Given 03/14/19 0644)  ondansetron (ZOFRAN-ODT) disintegrating tablet 4 mg (4 mg Oral Given 03/14/19 0458)  morphine 4 MG/ML injection 4 mg (4 mg Intravenous Given 03/14/19 0639)  ondansetron (ZOFRAN) injection 4 mg (4 mg Intravenous Given 03/14/19 0638)  sodium chloride 0.9 % bolus 1,000 mL  (1,000 mLs Intravenous New Bag/Given 03/14/19 0644)     Initial Impression / Assessment and Plan / ED Course  I have reviewed the triage vital signs and the nursing notes.  Pertinent labs & imaging results that were available during my care of the patient were reviewed by me and considered in my medical decision making (see chart for details).         Patient presents emergency department with right lower quadrant abdominal pain, vomiting and loose stools.  Vital signs  are reassuring.  On exam, patient is very uncomfortable, rocking in bed.  Significant right lower quadrant abdominal pain on exam.  Labs with leukocytosis.  Decreased bicarb but no anion gap.  No evidence of urinary tract infection.  Patient is not pregnant.  AST/ALT are within normal limits.  Concern for appendicitis.  Will give symptomatic therapy and obtain CT scan.  Less likely to be PID as patient denies symptoms and is not sexually active.  6:50 AM At shift change care was transferred to [facility], PA-C who will follow pending studies, re-evaulate and determine disposition.     Final Clinical Impressions(s) / ED Diagnoses   Final diagnoses:  RLQ abdominal pain    ED Discharge Orders    None       Mardene Sayer Boyd Kerbs 03/14/19 9702    Dione Booze, MD 03/14/19 [phone]

## 2019-03-14 NOTE — Discharge Instructions (Signed)
Your work-up today is reassuring, use Zofran as needed for nausea, Motrin and Tylenol as needed for pain.  Follow-up with your PCP.  Return to the ED for any new or worsening symptoms.

## 2019-03-14 NOTE — ED Provider Notes (Signed)
Care assumed from [facility] at shift change, please see her note for full details, but in brief Chelsea Frazier is a 23 y.o. female who presents with gradual onset and worsening of right lower quadrant abdominal pain 3 hours prior to arrival.  Associated with immediate vomiting.  Has had some loose stools as well.  History of previous cholecystectomy but appendix still in place.  Patient is not sexually active with no vaginal bleeding or discharge.  Concern for appendicitis versus right ovarian torsion.  Lab work thus far shows a leukocytosis of 14.5 but otherwise no abnormalities.  CT and pelvic ultrasound pending at shift change.  Plan: follow-up on CT scan and ultrasound  Physical Exam  BP 118/62 (BP Location: Right Arm)   Pulse 94   Temp 98.9 F (37.2 C) (Oral)   Resp (!) 22   Ht 5\' 7"  (1.702 m)   Wt 49.9 kg   LMP 02/15/2019   SpO2 98%   BMI 17.23 kg/m   Physical Exam Vitals signs and nursing note reviewed.  Constitutional:      General: She is not in acute distress.    Appearance: She is well-developed and normal weight. She is not diaphoretic.  HENT:     Head: Normocephalic and atraumatic.  Eyes:     General:        Right eye: No discharge.        Left eye: No discharge.  Pulmonary:     Effort: Pulmonary effort is normal. No respiratory distress.  Abdominal:     Comments: Abdomen is soft, nondistended, bowel sounds present throughout, there is no longer tenderness in the right lower quadrant,   Skin:    General: Skin is warm and dry.  Neurological:     Mental Status: She is alert.     Coordination: Coordination normal.  Psychiatric:        Behavior: Behavior normal.     ED Course/Procedures   Labs Reviewed  COMPREHENSIVE METABOLIC PANEL - Abnormal; Notable for the following components:      Result Value   CO2 20 (*)    Glucose, Bld 157 (*)    Alkaline Phosphatase 31 (*)    All other components within normal limits  CBC - Abnormal; Notable for the  following components:   WBC 14.5 (*)    All other components within normal limits  LIPASE, BLOOD  URINALYSIS, ROUTINE W REFLEX MICROSCOPIC  I-STAT BETA HCG BLOOD, ED ([facility], [facility], [facility] ONLY)   02/17/2019 Transvaginal Non-ob  Result Date: 03/14/2019 CLINICAL DATA:  Acute right lower quadrant abdominal pain EXAM: TRANSABDOMINAL AND TRANSVAGINAL ULTRASOUND OF PELVIS DOPPLER ULTRASOUND OF OVARIES TECHNIQUE: Both transabdominal and transvaginal ultrasound examinations of the pelvis were performed. Transabdominal technique was performed for global imaging of the pelvis including uterus, ovaries, adnexal regions, and pelvic cul-de-sac. It was necessary to proceed with endovaginal exam following the transabdominal exam to visualize the endometrium and ovaries. Color and duplex Doppler ultrasound was utilized to evaluate blood flow to the ovaries. COMPARISON:  None. FINDINGS: Uterus Measurements: 6.0 x 4.1 x 3.3 cm = volume: 41 mL. No fibroids or other mass visualized. Endometrium Thickness: 6 mm which is within normal limits. No focal abnormality visualized. Right ovary Measurements: 3.1 x 2.5 x 2.3 cm = volume: 10 mL. Normal appearance/no adnexal mass. Left ovary Measurements: 3.5 x 2.9 x 2.2 cm = volume: 12 mL. Normal appearance/no adnexal mass. Pulsed Doppler evaluation of both ovaries demonstrates normal low-resistance arterial and venous  waveforms. Other findings No abnormal free fluid. IMPRESSION: No abnormality seen in the pelvis. No evidence of ovarian torsion or mass. Electronically Signed   By: Lupita RaiderJames  Green Jr M.D.   On: 03/14/2019 08:20   [place] Pelvis Complete  Result Date: 03/14/2019 CLINICAL DATA:  Acute right lower quadrant abdominal pain EXAM: TRANSABDOMINAL AND TRANSVAGINAL ULTRASOUND OF PELVIS DOPPLER ULTRASOUND OF OVARIES TECHNIQUE: Both transabdominal and transvaginal ultrasound examinations of the pelvis were performed. Transabdominal technique was performed for global imaging of the pelvis including  uterus, ovaries, adnexal regions, and pelvic cul-de-sac. It was necessary to proceed with endovaginal exam following the transabdominal exam to visualize the endometrium and ovaries. Color and duplex Doppler ultrasound was utilized to evaluate blood flow to the ovaries. COMPARISON:  None. FINDINGS: Uterus Measurements: 6.0 x 4.1 x 3.3 cm = volume: 41 mL. No fibroids or other mass visualized. Endometrium Thickness: 6 mm which is within normal limits. No focal abnormality visualized. Right ovary Measurements: 3.1 x 2.5 x 2.3 cm = volume: 10 mL. Normal appearance/no adnexal mass. Left ovary Measurements: 3.5 x 2.9 x 2.2 cm = volume: 12 mL. Normal appearance/no adnexal mass. Pulsed Doppler evaluation of both ovaries demonstrates normal low-resistance arterial and venous waveforms. Other findings No abnormal free fluid. IMPRESSION: No abnormality seen in the pelvis. No evidence of ovarian torsion or mass. Electronically Signed   By: Lupita RaiderJames  Green Jr M.D.   On: 03/14/2019 08:20   [place] Art/ven Flow Abd Pelv Doppler  Result Date: 03/14/2019 CLINICAL DATA:  Acute right lower quadrant abdominal pain EXAM: TRANSABDOMINAL AND TRANSVAGINAL ULTRASOUND OF PELVIS DOPPLER ULTRASOUND OF OVARIES TECHNIQUE: Both transabdominal and transvaginal ultrasound examinations of the pelvis were performed. Transabdominal technique was performed for global imaging of the pelvis including uterus, ovaries, adnexal regions, and pelvic cul-de-sac. It was necessary to proceed with endovaginal exam following the transabdominal exam to visualize the endometrium and ovaries. Color and duplex Doppler ultrasound was utilized to evaluate blood flow to the ovaries. COMPARISON:  None. FINDINGS: Uterus Measurements: 6.0 x 4.1 x 3.3 cm = volume: 41 mL. No fibroids or other mass visualized. Endometrium Thickness: 6 mm which is within normal limits. No focal abnormality visualized. Right ovary Measurements: 3.1 x 2.5 x 2.3 cm = volume: 10 mL. Normal  appearance/no adnexal mass. Left ovary Measurements: 3.5 x 2.9 x 2.2 cm = volume: 12 mL. Normal appearance/no adnexal mass. Pulsed Doppler evaluation of both ovaries demonstrates normal low-resistance arterial and venous waveforms. Other findings No abnormal free fluid. IMPRESSION: No abnormality seen in the pelvis. No evidence of ovarian torsion or mass. Electronically Signed   By: Lupita RaiderJames  Green Jr M.D.   On: 03/14/2019 08:20     Procedures  MDM    23 year old female presents with right lower quadrant abdominal pain.  Labs significant for leukocytosis of 14.5 but otherwise reassuring.  No evidence of ovarian cyst or torsion on pelvic ultrasound and CT shows a normal appendix with no other acute abnormalities noted.  On repeat exam patient has had no further vomiting and she has no tenderness in the right lower quadrant.  Pain may be related to history of gastroparesis, will discharge with Zofran.  PCP follow-up encouraged.  Return precautions discussed.  Final diagnoses:  RLQ abdominal pain  Non-intractable vomiting with nausea, unspecified vomiting type    ED Discharge Orders         Ordered    ondansetron (ZOFRAN ODT) 4 MG disintegrating tablet     03/14/19 1008  Jacqlyn Larsen, PA-C 03/14/19 1010    Lacretia Leigh, MD 03/15/19 1408

## 2019-03-14 NOTE — ED Triage Notes (Signed)
Pt reporting onset of vomiting and diarrhea that started about 3 hours ago. Right sided abdominal pain. Denies fevers.

## 2021-03-30 IMAGING — US US PELVIS COMPLETE
1 series · 13 of 25 positions shown · non-contrast
Comparison: None.

CLINICAL DATA: Acute right lower quadrant abdominal pain

EXAM:
TRANSABDOMINAL AND TRANSVAGINAL ULTRASOUND OF PELVIS
DOPPLER ULTRASOUND OF OVARIES
TECHNIQUE: Both transabdominal and transvaginal ultrasound examinations of the
pelvis were performed. Transabdominal technique was performed for
global imaging of the pelvis including uterus, ovaries, adnexal
regions, and pelvic cul-de-sac.
It was necessary to proceed with endovaginal exam following the
transabdominal exam to visualize the endometrium and ovaries. Color
and duplex Doppler ultrasound was utilized to evaluate blood flow to
the ovaries.

[Series 1: us pelvis complete · 13 of 100 slices shown]
[im 1/100]
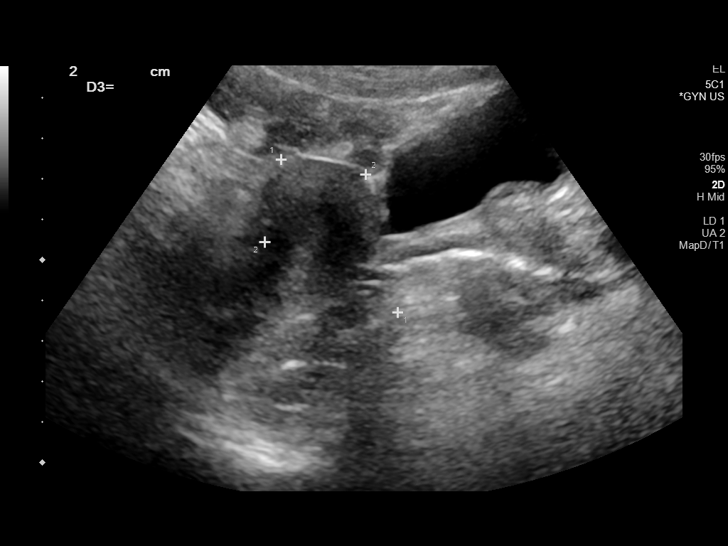
[im 9/100]
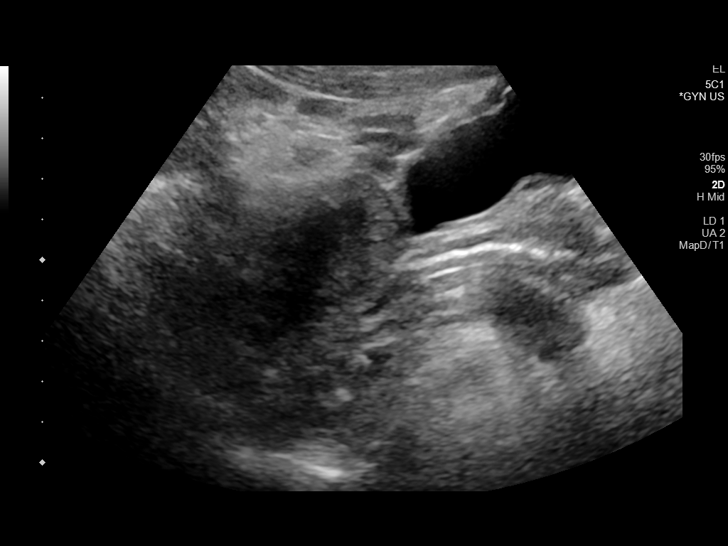
[im 17/100]
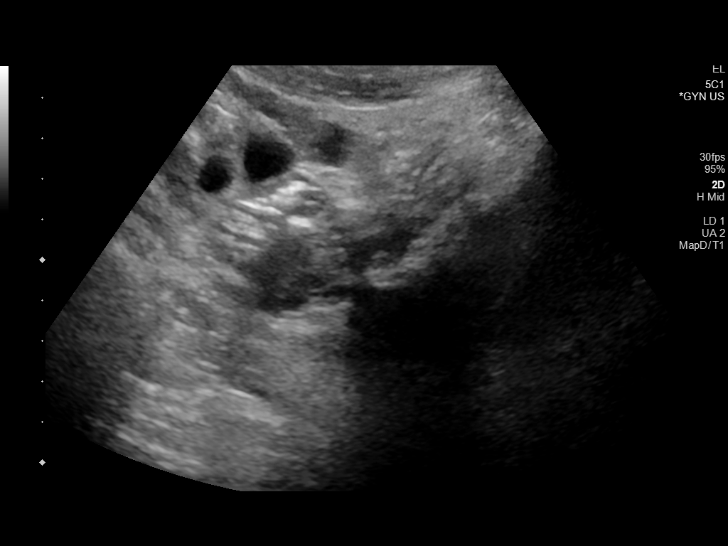
[im 25/100]
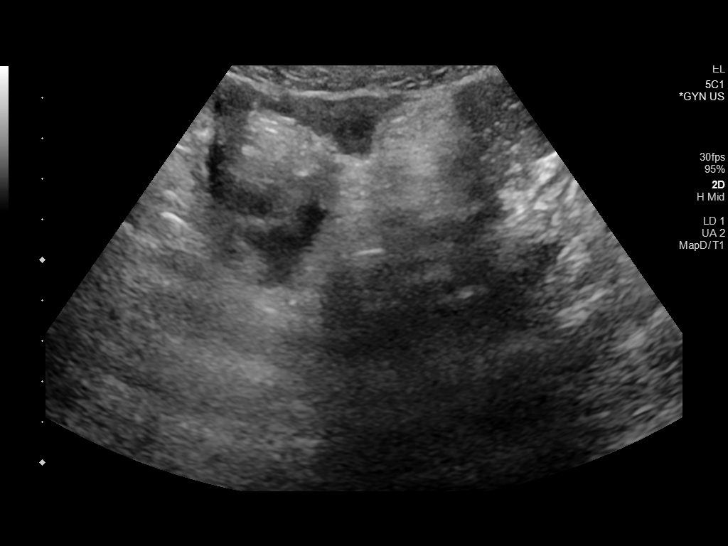
[im 34/100]
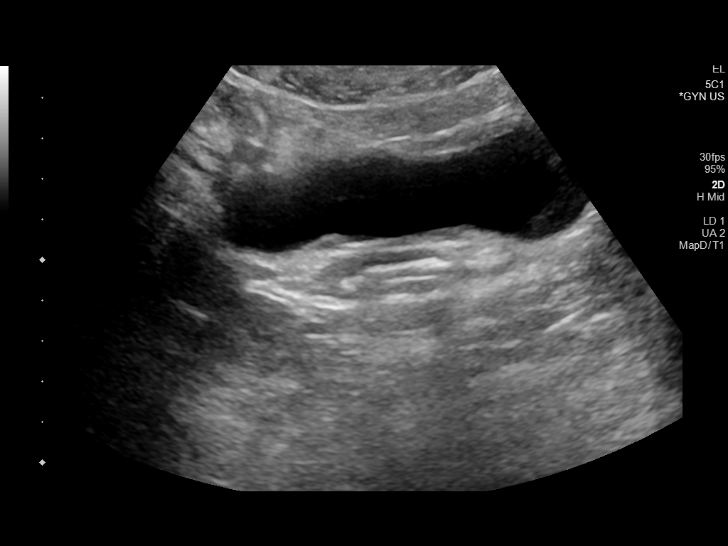
[im 42/100]
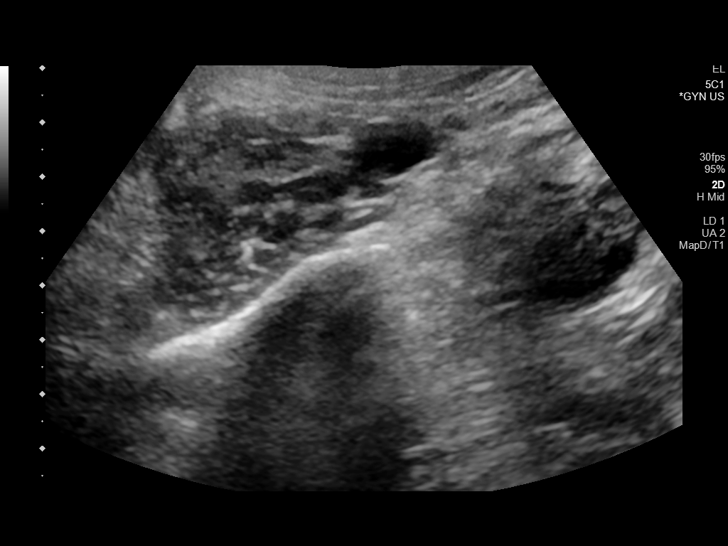
[im 50/100]
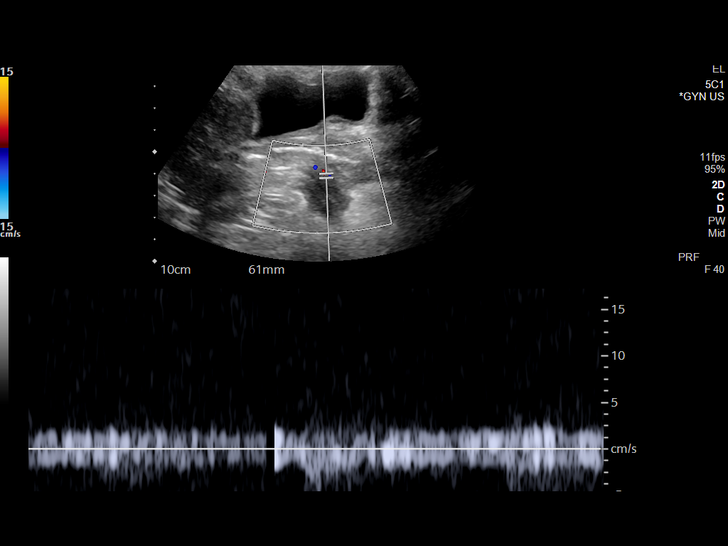
[im 58/100]
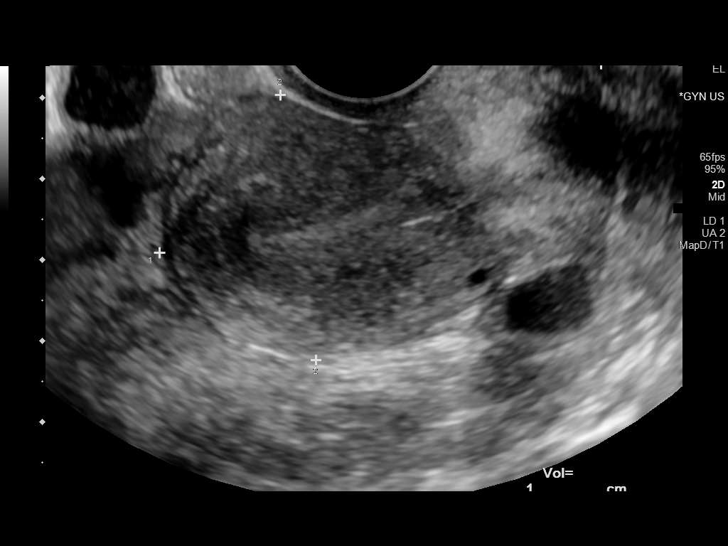
[im 67/100]
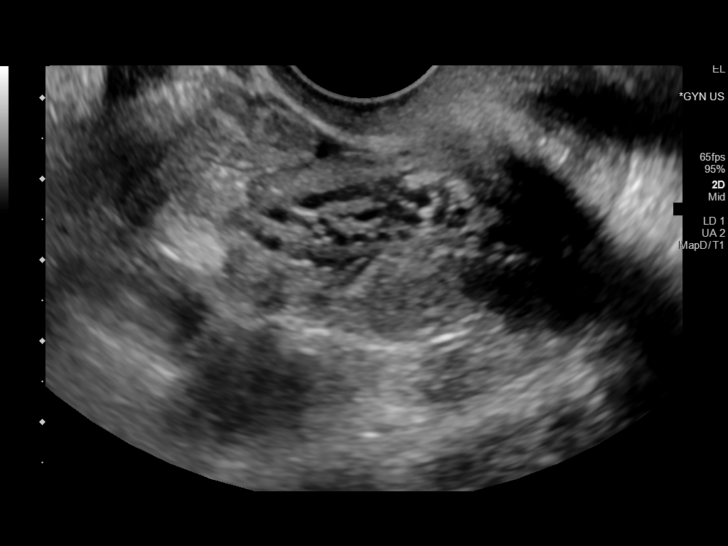
[im 75/100]
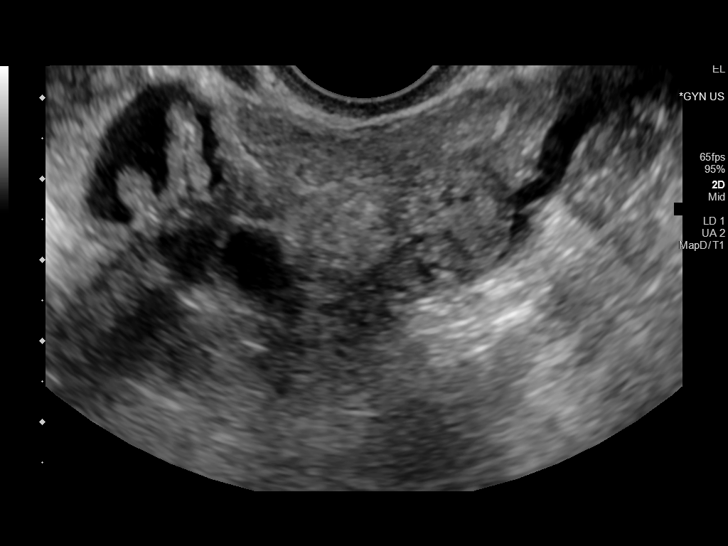
[im 83/100]
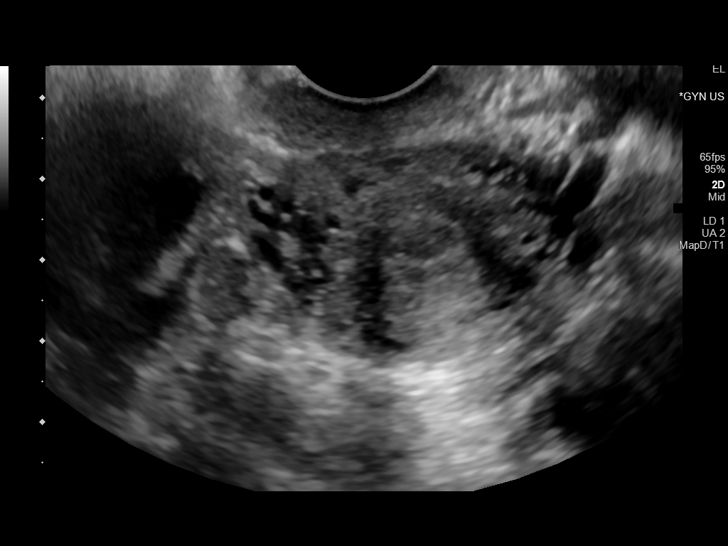
[im 91/100]
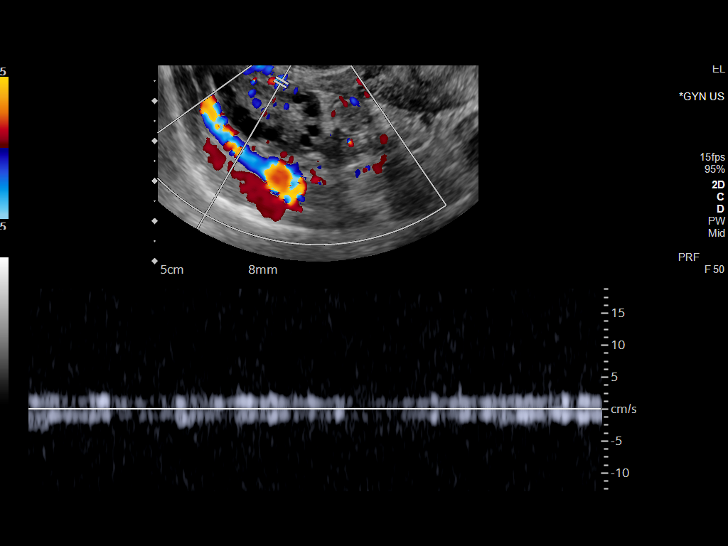
[im 100/100]
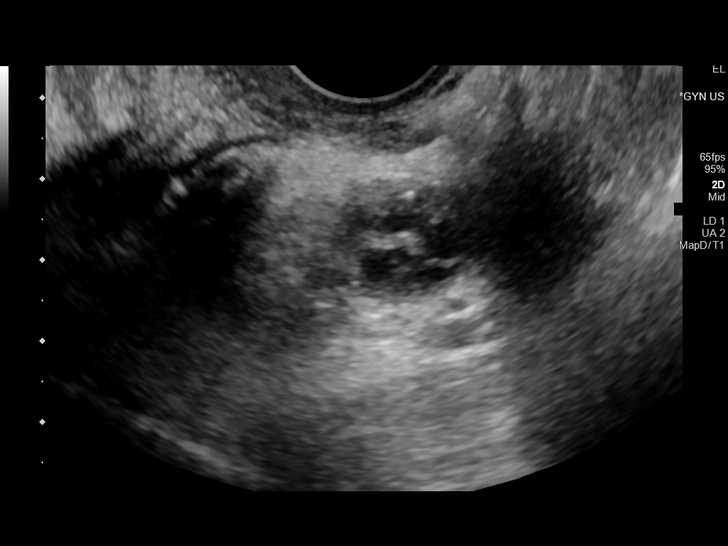

[13 of 25 positions shown; findings below may reference images not displayed]

FINDINGS: Uterus

Measurements: 6.0 x 4.1 x 3.3 cm = volume: 41 mL. No fibroids or
other mass visualized.

Endometrium

Thickness: 6 mm which is within normal limits. No focal abnormality
visualized.

Right ovary

Measurements: 3.1 x 2.5 x 2.3 cm = volume: 10 mL. Normal
appearance/no adnexal mass.

Left ovary

Measurements: 3.5 x 2.9 x 2.2 cm = volume: 12 mL. Normal
appearance/no adnexal mass.

Pulsed Doppler evaluation of both ovaries demonstrates normal
low-resistance arterial and venous waveforms.

Other findings

No abnormal free fluid.
IMPRESSION: No abnormality seen in the pelvis. No evidence of ovarian torsion or
mass.

## 2021-03-30 IMAGING — US US TRANSVAGINAL NON-OB
1 series · 13 of 25 positions shown · non-contrast
Comparison: None.

CLINICAL DATA: Acute right lower quadrant abdominal pain

EXAM:
TRANSABDOMINAL AND TRANSVAGINAL ULTRASOUND OF PELVIS
DOPPLER ULTRASOUND OF OVARIES
TECHNIQUE: Both transabdominal and transvaginal ultrasound examinations of the
pelvis were performed. Transabdominal technique was performed for
global imaging of the pelvis including uterus, ovaries, adnexal
regions, and pelvic cul-de-sac.
It was necessary to proceed with endovaginal exam following the
transabdominal exam to visualize the endometrium and ovaries. Color
and duplex Doppler ultrasound was utilized to evaluate blood flow to
the ovaries.

[Series 1: us transvaginal non-ob · 13 of 100 slices shown]
[im 1/100]
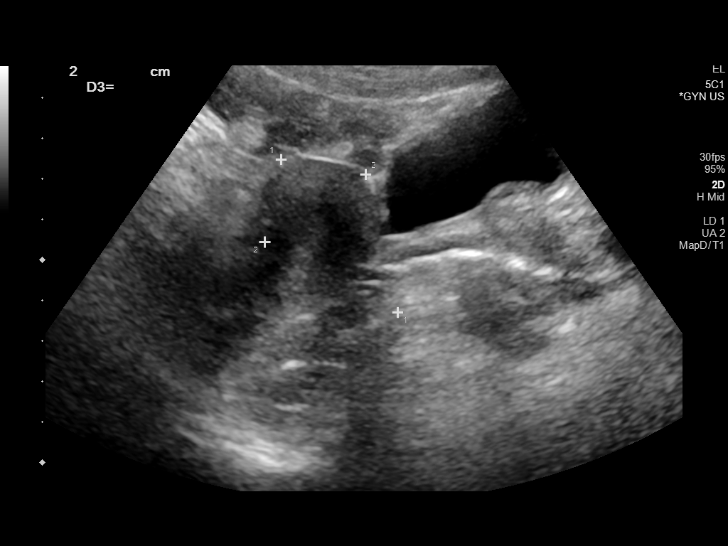
[im 9/100]
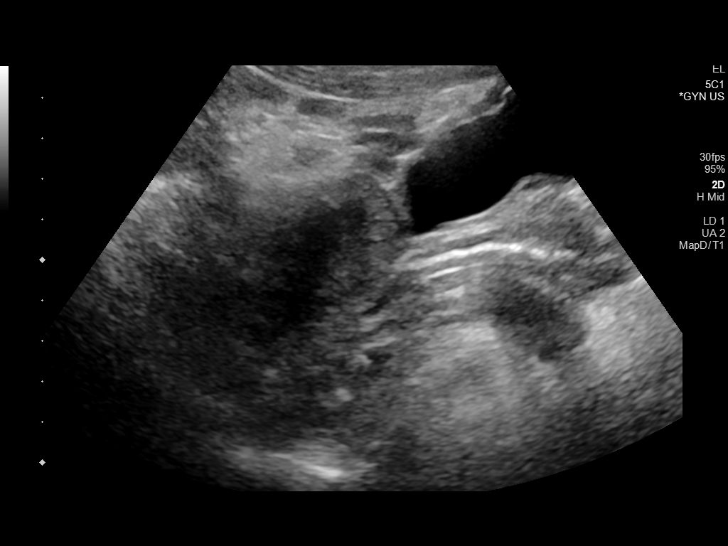
[im 17/100]
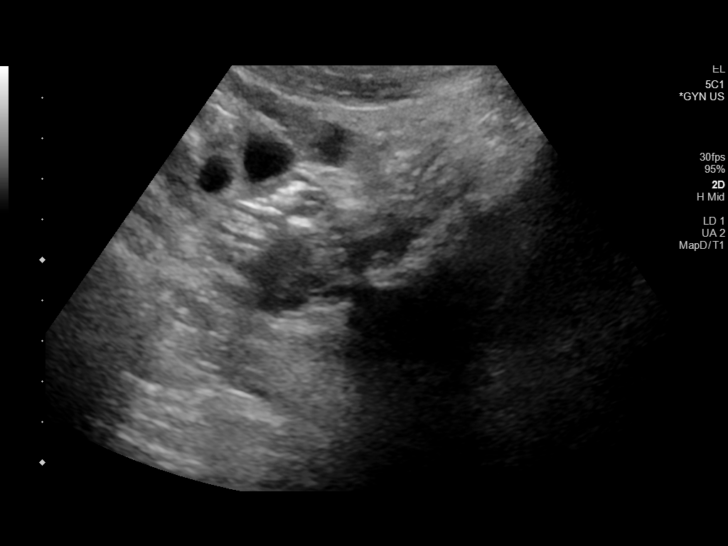
[im 25/100]
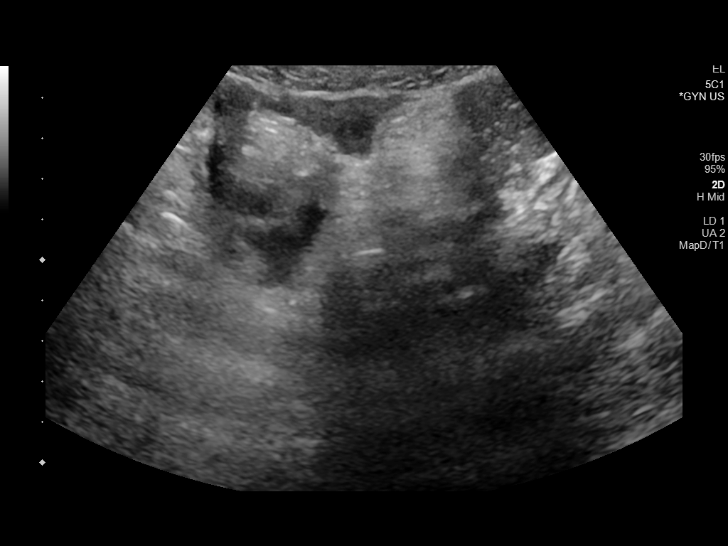
[im 34/100]
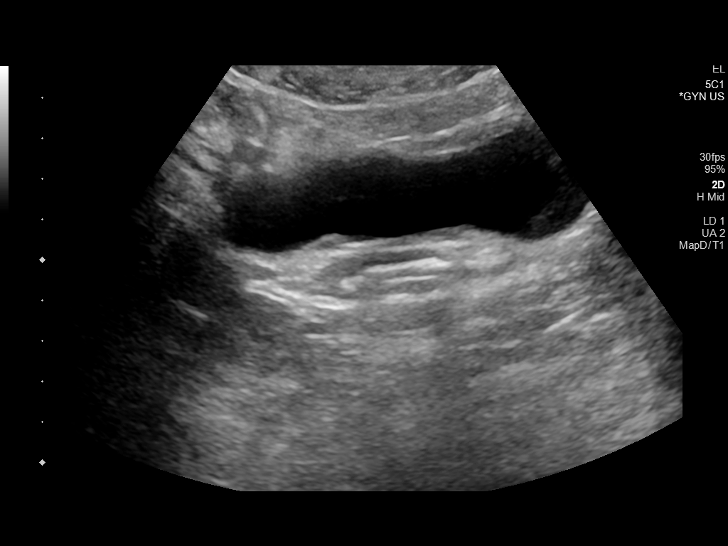
[im 42/100]
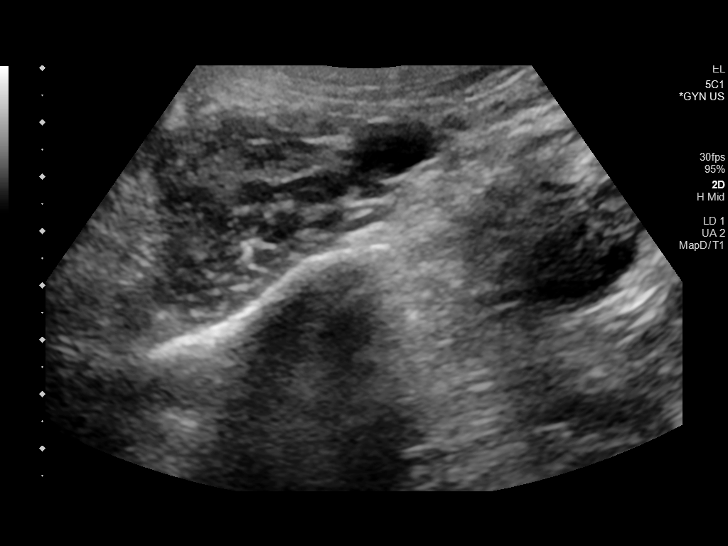
[im 50/100]
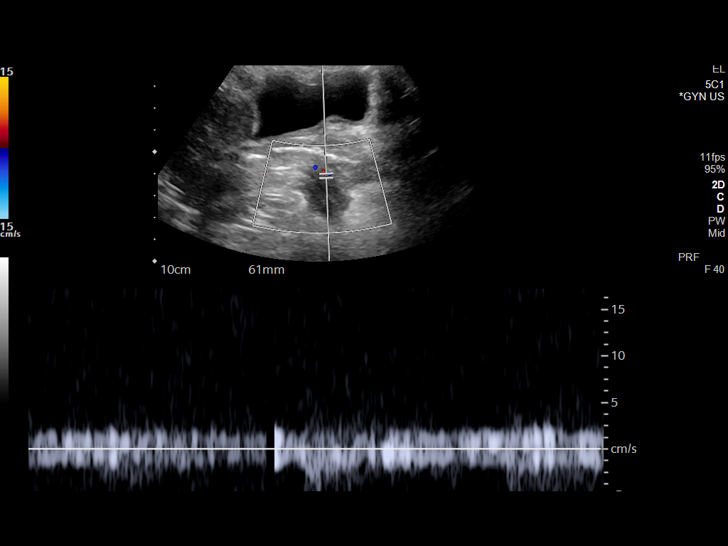
[im 58/100]
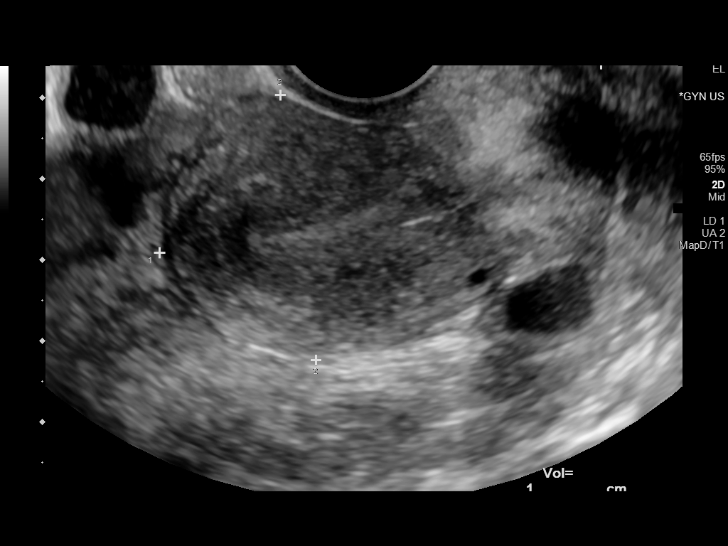
[im 67/100]
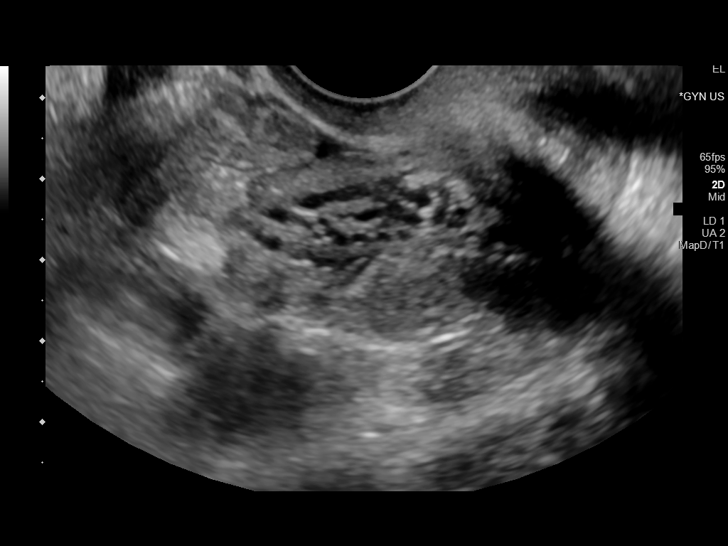
[im 75/100]
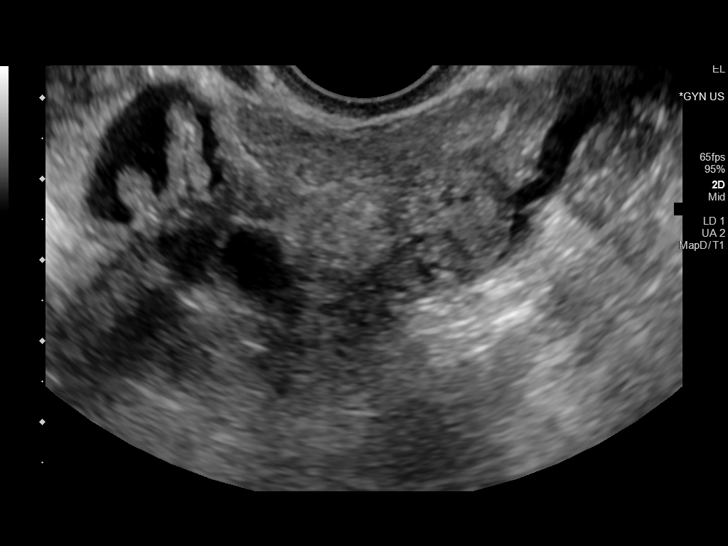
[im 83/100]
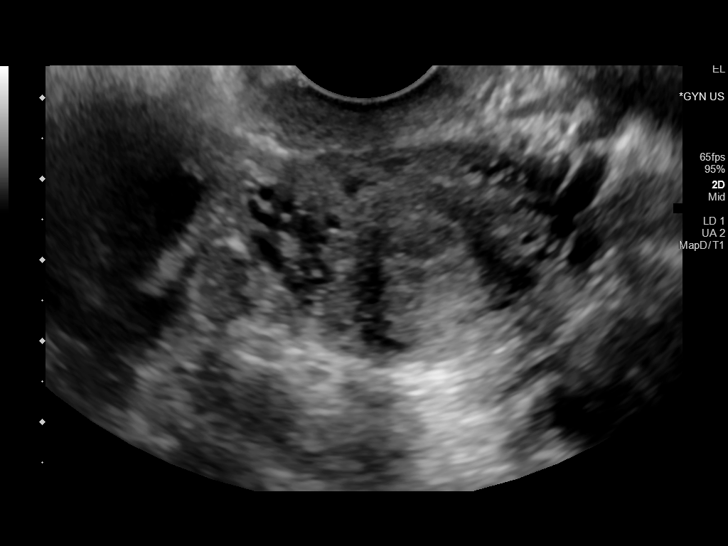
[im 91/100]
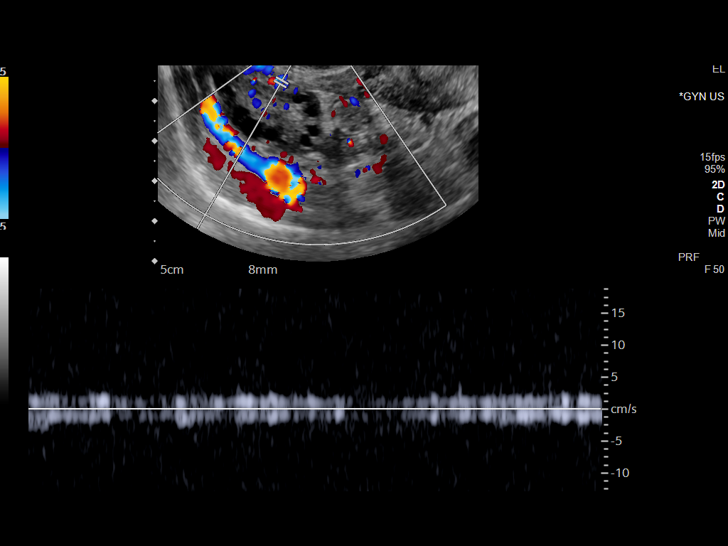
[im 100/100]
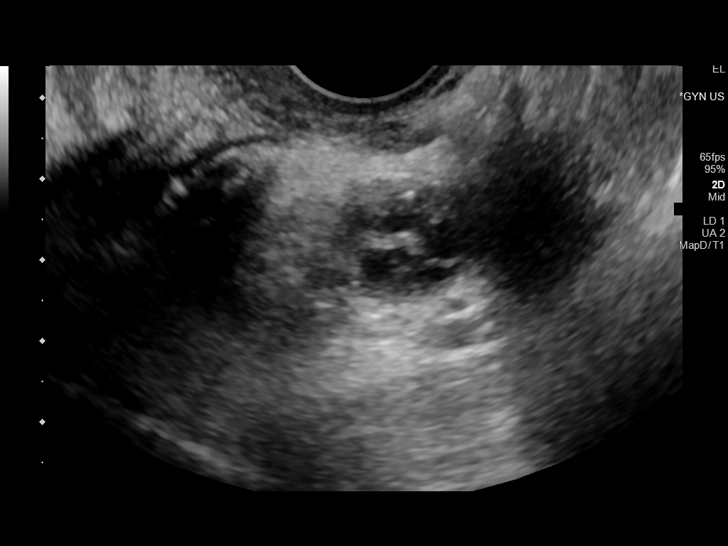

[13 of 25 positions shown; findings below may reference images not displayed]

FINDINGS: Uterus

Measurements: 6.0 x 4.1 x 3.3 cm = volume: 41 mL. No fibroids or
other mass visualized.

Endometrium

Thickness: 6 mm which is within normal limits. No focal abnormality
visualized.

Right ovary

Measurements: 3.1 x 2.5 x 2.3 cm = volume: 10 mL. Normal
appearance/no adnexal mass.

Left ovary

Measurements: 3.5 x 2.9 x 2.2 cm = volume: 12 mL. Normal
appearance/no adnexal mass.

Pulsed Doppler evaluation of both ovaries demonstrates normal
low-resistance arterial and venous waveforms.

Other findings

No abnormal free fluid.
IMPRESSION: No abnormality seen in the pelvis. No evidence of ovarian torsion or
mass.
